# Patient Record
Sex: Female | Born: 1975 | Race: Black or African American | Hispanic: No | Marital: Single | Smoking: Never smoker
Health system: Southern US, Community
[De-identification: ages and names within clinical notes are randomized; demographics above are authoritative.]

## PROBLEM LIST (undated history)

## (undated) ENCOUNTER — Inpatient Hospital Stay: Payer: Self-pay

## (undated) DIAGNOSIS — I10 Essential (primary) hypertension: Secondary | ICD-10-CM

## (undated) DIAGNOSIS — E119 Type 2 diabetes mellitus without complications: Secondary | ICD-10-CM

## (undated) DIAGNOSIS — D649 Anemia, unspecified: Secondary | ICD-10-CM

## (undated) DIAGNOSIS — E7800 Pure hypercholesterolemia, unspecified: Secondary | ICD-10-CM

## (undated) HISTORY — DX: Essential (primary) hypertension: I10

## (undated) HISTORY — DX: Type 2 diabetes mellitus without complications: E11.9

## (undated) HISTORY — DX: Pure hypercholesterolemia, unspecified: E78.00

## (undated) HISTORY — DX: Anemia, unspecified: D64.9

## (undated) HISTORY — PX: COLONOSCOPY: SHX174

## (undated) HISTORY — PX: DENTAL SURGERY: SHX609

---

## 2001-03-31 ENCOUNTER — Other Ambulatory Visit: Admission: RE | Admit: 2001-03-31 | Discharge: 2001-03-31 | Payer: Self-pay | Admitting: Obstetrics and Gynecology

## 2006-10-22 ENCOUNTER — Ambulatory Visit: Payer: Self-pay | Admitting: Obstetrics & Gynecology

## 2009-06-29 ENCOUNTER — Emergency Department: Payer: Self-pay | Admitting: Emergency Medicine

## 2015-05-11 DIAGNOSIS — Z8249 Family history of ischemic heart disease and other diseases of the circulatory system: Secondary | ICD-10-CM | POA: Insufficient documentation

## 2015-05-11 DIAGNOSIS — A6000 Herpesviral infection of urogenital system, unspecified: Secondary | ICD-10-CM | POA: Insufficient documentation

## 2015-09-12 DIAGNOSIS — I10 Essential (primary) hypertension: Secondary | ICD-10-CM | POA: Insufficient documentation

## 2017-07-12 DIAGNOSIS — E785 Hyperlipidemia, unspecified: Secondary | ICD-10-CM | POA: Insufficient documentation

## 2019-04-16 ENCOUNTER — Other Ambulatory Visit: Payer: Self-pay

## 2019-04-16 DIAGNOSIS — Z20822 Contact with and (suspected) exposure to covid-19: Secondary | ICD-10-CM

## 2019-04-17 ENCOUNTER — Other Ambulatory Visit: Payer: Self-pay | Admitting: Family Medicine

## 2019-04-17 DIAGNOSIS — Z1231 Encounter for screening mammogram for malignant neoplasm of breast: Secondary | ICD-10-CM

## 2019-04-17 LAB — NOVEL CORONAVIRUS, NAA: SARS-CoV-2, NAA: NOT DETECTED

## 2019-06-04 ENCOUNTER — Ambulatory Visit
Admission: RE | Admit: 2019-06-04 | Discharge: 2019-06-04 | Disposition: A | Discharge status: Home / Self Care | Payer: BC Managed Care – PPO | Source: Ambulatory Visit | Attending: Family Medicine | Admitting: Family Medicine

## 2019-06-04 DIAGNOSIS — Z1231 Encounter for screening mammogram for malignant neoplasm of breast: Secondary | ICD-10-CM

## 2019-07-25 DIAGNOSIS — R739 Hyperglycemia, unspecified: Secondary | ICD-10-CM | POA: Insufficient documentation

## 2020-02-10 DIAGNOSIS — E119 Type 2 diabetes mellitus without complications: Secondary | ICD-10-CM | POA: Insufficient documentation

## 2020-02-19 ENCOUNTER — Encounter: Payer: BC Managed Care – PPO | Attending: Family Medicine | Admitting: Dietician

## 2020-02-19 ENCOUNTER — Other Ambulatory Visit: Payer: Self-pay

## 2020-02-19 ENCOUNTER — Encounter: Payer: Self-pay | Admitting: Dietician

## 2020-02-19 VITALS — Ht 66.0 in | Wt 246.5 lb

## 2020-02-19 DIAGNOSIS — E119 Type 2 diabetes mellitus without complications: Secondary | ICD-10-CM

## 2020-02-19 NOTE — Progress Notes (Signed)
Medical Nutrition Therapy: Visit start time: 1315  end time: 1415  Assessment:  Diagnosis: Type 2 DM Past medical history: HTN, HLD Psychosocial issues/ stress concerns: reports high stress, feels she is not dealing well with stress.  Preferred learning method:   Visual   Current weight: 246.5lbs Height: 5'6" Medications, supplements: reconciled list in medical record  Progress and evaluation:   Patient reports having pre-diabetes for the past 3 years, recently HbA1C increased to 6.6% so has now been diagnosed with Type 2 diabetes.   She has begun checking BGs at home with Accu Chek monitor. Reports fasting BGs ranging 110-115 average, has checked a few times after eating, highest was 175.   Has supportive friend who also has diabetes.  Patient is experiencing high stress due to preparing for return to in-person teaching, taking college classes, and parenting 2 teenagers. She feels her stress has in part led to increased blood sugars.  She would like to lose weight to goal of about 185lbs.  Physical activity: walking 40 minutes, 5-7 days a week, with friend  Dietary Intake:  Usual eating pattern includes 3 meals and 2-3 snacks per day. Dining out frequency: 2 meals per week.  Breakfast: on school days-- 2 link sausage and 2 boiled eggs, coffee with low sugar creamer or occ regular creamer; occ Mcdonalds. Usually skips when not in school Snack: 10am-- when at school -- crackers with sour cream and chives; honey nut cheerios (dry); activia yogurt  Lunch: school-- school lunch; at home-- sandwich with pimento cheese or chicken salad, (also used to eat egg salad) with cheetos, water with flavoring added Snack: cheerios; small bag Takis  Supper: 4-5:30pm-- does not cook much (forgets food on stove, etc); usually prepares [employer] frozen fam meals + bagged salad; or fast food [employer] or taco bell about 2x a week Snack: 9:30pm--tries to have protein helps keep FBGs lower -- 2 boiled  eggs; P3 snack pack-- nuts, cheese, meat; cheese and whole wheat Ritz crackers Beverages: water with sugar free flavoring; coffee, occ regular soda  Nutrition Care Education: Topics covered:  Basic nutrition: basic food groups, appropriate nutrient balance, appropriate meal and snack schedule, general nutrition guidelines    Weight control: importance of low sugar and low fat choices, portion control, estimated energy needs for weight loss at 1800 kcal, provided guidance for 45% CHO, 25% protein, and 30% fat Advanced nutrition: cooking techniques, dining out, food label reading for carbohydrate Diabetes:  goals for BGs, appropriate meal and snack schedule, appropriate carb intake and balance, healthy carb choices, role of fiber, protein, fat; roles of exercise, stress   Nutritional Diagnosis:  [place]-2.2 Altered nutrition-related laboratory As related to diabetes.  As evidenced by recent HbA1C of 6.6%. [place]-3.3 Overweight/obesity As related to excess calories, stress.  As evidenced by patient with current BMI of 39.8, working on diet and lifestyle changes to promote weight loss and improve BGs.  Intervention:   Instruction and discussion as noted above.  Patient has been working on healthy changes and is motivated to continue; has good support from close friend.  Established goals for additional change, with direction from patient.   Education Materials given:   [employer] guidelines for Diabetes  Plate Planner with food lists, sample meal pattern  Sample menus  Goals/ instructions   Learner/ who was taught:   Patient    Level of understanding:  Verbalizes/ demonstrates competency   Demonstrated degree of understanding via:   Teach back Learning barriers:  None  Willingness  to learn/ readiness for change:  Eager, change in progress   Monitoring and Evaluation:  Dietary intake, exercise, BG control, and body weight      follow up: 03/30/20 at 4:00pm

## 2020-02-19 NOTE — Patient Instructions (Signed)
   Check food labels for total carbohydrate, aim for 30-45grams with each meal (make sure to adjust the serving size if needed to meet the goal).   Try plating low-carb veggies first with meals to leave less room for starchy foods.   Great job controlling sugar intake, keep up the good work!  Keep up the regular exercise!

## 2020-03-30 ENCOUNTER — Other Ambulatory Visit: Payer: Self-pay

## 2020-03-30 ENCOUNTER — Encounter: Payer: BC Managed Care – PPO | Attending: Family Medicine | Admitting: Dietician

## 2020-03-30 ENCOUNTER — Encounter: Payer: Self-pay | Admitting: Dietician

## 2020-03-30 VITALS — Ht 66.0 in | Wt 245.4 lb

## 2020-03-30 DIAGNOSIS — E119 Type 2 diabetes mellitus without complications: Secondary | ICD-10-CM | POA: Diagnosis not present

## 2020-03-30 NOTE — Patient Instructions (Signed)
   Continue with protein snack in the evenings to help with blood sugar control.   Keep up the great regular exercise!  Include a vegetable or fruit as often as possible; try adding some grape tomatoes, sliced or mini bell peppers, or baby carrots with sandwich at lunch.

## 2020-03-30 NOTE — Progress Notes (Signed)
Medical Nutrition Therapy: Visit start time: 1600  end time: 1630  Assessment:  Diagnosis: Type 2 diabetes Medical history changes: no changes Psychosocial issues/ stress concerns: high stress level  Current weight: 245.4lbs  Height: 5'6"  Medications, supplement changes: no changes per patient  Progress and evaluation:  . Weight has decreased by 1.5lbs since previous visit. . Patient reports more label reading, using [employer] pick up to check products online before buying.  . 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  Marland Kitchen Has been very tired in evening in the past week, and missing evening snack, and fasting BGs have been in 150s. Ate boiled egg yesterday evening and BG this morning was 112.  Marland Kitchen Checks BGs fasting and before lunch daily. . She has been searching for healthy meal ideas and recipes on Pinterest to improve nutritional balance while keeping cooking simple and quick.   Physical activity: walking 40 minutes, 3x a week with friend, 2x a week alone + more general activity  Dietary Intake:  Usual eating pattern includes 2-3 meals and 2 snacks per day. Dining out frequency: 1-2 meals per week (dinner).  Breakfast: boiled egg and sasuage or protein shake; skips when not at work Snack: 10am P3 packed snack; sour cream and chive crackers, dry cereal, yogurt Lunch: sandwich with egg salad or pimento cheese + side from school; school lunch; sandwich when home Snack: cheerios or Takis; pretzels Supper: frozen family meals; one-pan roasted meals (trying to set timer); looking up low-carb easy recipes; eating out some with child in driver's ed, ends tomorrow Snack: protein based snack, including occasional protein drink Beverages: water, some with sugar free flavoring; coffee; had one soda in past few weeks  Nutrition Care Education: Topics covered:  Basic nutrition: importance of frequent intake of veg and fruits for overall health risk reduction as well as  weight control    Weight control: reviewed progress since previous visit Advanced nutrition:  Quick cooking strategies, one-dish meal options Diabetes:  healthy carb choices for lower glycemic response, effect of evening snack on fasting BGs   Nutritional Diagnosis:  [place]-2.2 Altered nutrition-related laboratory As related to Type 2 diabetes.  As evidenced by recent HbA1C of 6.6%. [place]-3.3 Overweight/obesity As related to history of excess calories and inadequate physical activity.  As evidenced by patient with current BMI of 39.6, making diet and lifestyle changes to promote weight loss and blood sugar control.  Intervention:  . Instruction and discussion as noted above. . Commended patient for working on healthy meal options and maintaining regular exercise. Marland Kitchen Updated nutrition goals with direction from patient.  Education Materials given:  . [employer] a [employer] . Goals/ instructions   Learner/ who was taught:  . Patient   Level of understanding: Marland Kitchen Verbalizes/ demonstrates competency   Demonstrated degree of understanding via:   Teach back Learning barriers: . None  Willingness to learn/ readiness for change: . Eager, change in progress  Monitoring and Evaluation:  Dietary intake, exercise, BG control, and body weight      follow up: 06/01/20 at 4:00pm

## 2020-06-01 ENCOUNTER — Encounter: Payer: BC Managed Care – PPO | Attending: Family Medicine | Admitting: Dietician

## 2020-06-01 ENCOUNTER — Other Ambulatory Visit: Payer: Self-pay

## 2020-06-01 ENCOUNTER — Encounter: Payer: Self-pay | Admitting: Dietician

## 2020-06-01 VITALS — Ht 66.0 in | Wt 239.4 lb

## 2020-06-01 DIAGNOSIS — E119 Type 2 diabetes mellitus without complications: Secondary | ICD-10-CM | POA: Diagnosis not present

## 2020-06-01 NOTE — Patient Instructions (Signed)
   Awesome job making healthy choices and planning ahead for holidays!!  Allow for occasional treats as needed with guilty feelings.   Keep up the regular exercise!

## 2020-06-01 NOTE — Progress Notes (Signed)
Medical Nutrition Therapy: Visit start time: 1600  end time: 1630  Assessment:  Diagnosis: Type 2 diabetes Medical history changes: no changes Psychosocial issues/ stress concerns: none  Current weight: 239.4lbs Height: 5'6" Medications, supplement changes: reconciled list in medical record  Progress and evaluation:   Fasting BGs 82, 104, 112, 101, 102, 108. Had a reading of 133 after walking dog and doing some chores but before eating.  Before lunch BGs typically in 90s.   Weight loss of 6lbs since previous visit on 03/30/20.  Continues to increase home meals, cooking 4-5 times a week now. She and her son have been using skillet meal handout and planning meals together.    Physical activity: walking 40 minutes 4-5 times a week  Dietary Intake:  Usual eating pattern includes 3 meals and 1-2 snacks per day. Dining out frequency: 3-4 meals per week.  Breakfast: usually protein shake recently, sometimes with fruit or yogurt  Snack: veggie straws snack Lunch: school lunch Snack: 2:30pm fruit or mini lunchable or P3 snack pack Supper: 6-7pm -- one-pot meals, frozen fam meal Snack: none Beverages: water plain for often than flavored (1x a day with lunch); less coffee now once every 2 weeks; no sodas recently  Nutrition Care Education: Topics covered:     Weight control: reviewed progress since previous visit; discussed benefits of personal support; discussed healthy relationship and mindset regarding food; instructed on avoidance of guilt feelings about food consumption, addressing food cravings as needed, and planning ahead for special circumstances such as holidays Diabetes:  reviewed appropriate nutritional balance for meals and snacks   Nutritional Diagnosis:  [place]-2.2 Altered nutrition-related laboratory As related to Type 2 diabetes.  As evidenced by patient with recent HbA1C of 6.6%. [place]-3.3 Overweight/obesity As related to history of excess calories and inadequate physical  activity.  As evidenced by patient with current BMI of 38, working on healthy diet and regular exercise for ongoing weight loss.  Intervention:   Instruction and discussion as noted above.  Commended patient for her progress.   No new nutrition goals, patient will continue with her current healthy habits, supported by friend.   No follow-up scheduled at this time; patient will schedule later if needed.   Education Materials given:    Learner/ who was taught:   Patient   Level of understanding:  Verbalizes/ demonstrates competency   Demonstrated degree of understanding via:   Teach back Learning barriers:  None  Willingness to learn/ readiness for change:  Eager, change in progress   Monitoring and Evaluation:  Dietary intake, exercise, BG control, and body weight      follow up: prn

## 2020-10-06 ENCOUNTER — Other Ambulatory Visit: Payer: Self-pay | Admitting: Family Medicine

## 2020-10-06 DIAGNOSIS — Z1231 Encounter for screening mammogram for malignant neoplasm of breast: Secondary | ICD-10-CM

## 2020-10-24 ENCOUNTER — Ambulatory Visit
Admission: RE | Admit: 2020-10-24 | Discharge: 2020-10-24 | Disposition: A | Discharge status: Home / Self Care | Payer: BC Managed Care – PPO | Source: Ambulatory Visit | Attending: Family Medicine | Admitting: Family Medicine

## 2020-10-24 ENCOUNTER — Other Ambulatory Visit: Payer: Self-pay

## 2020-10-24 DIAGNOSIS — Z1231 Encounter for screening mammogram for malignant neoplasm of breast: Secondary | ICD-10-CM | POA: Diagnosis present

## 2021-08-11 IMAGING — MG MM DIGITAL SCREENING BILAT W/ TOMO AND CAD
8 series · 8 of 24 positions shown · non-contrast
Comparison: Previous exam(s).

CLINICAL DATA: Screening.

EXAM:
DIGITAL SCREENING BILATERAL MAMMOGRAM WITH TOMOSYNTHESIS AND CAD
TECHNIQUE: Bilateral screening digital craniocaudal and mediolateral oblique
mammograms were obtained. Bilateral screening digital breast
tomosynthesis was performed. The images were evaluated with
computer-aided detection.

[L CC synth-2D]
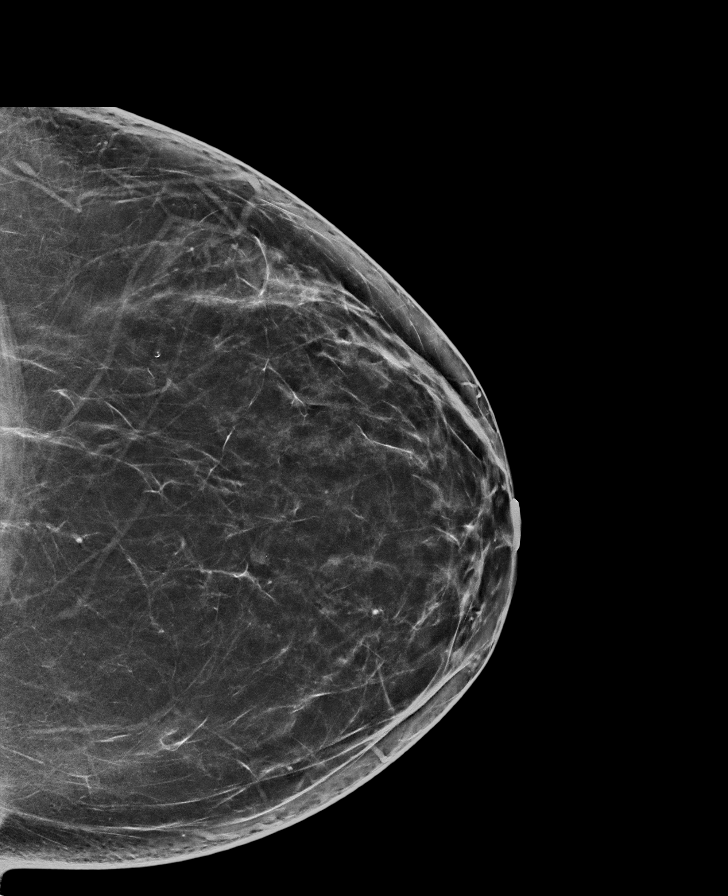

[L MLO synth-2D]
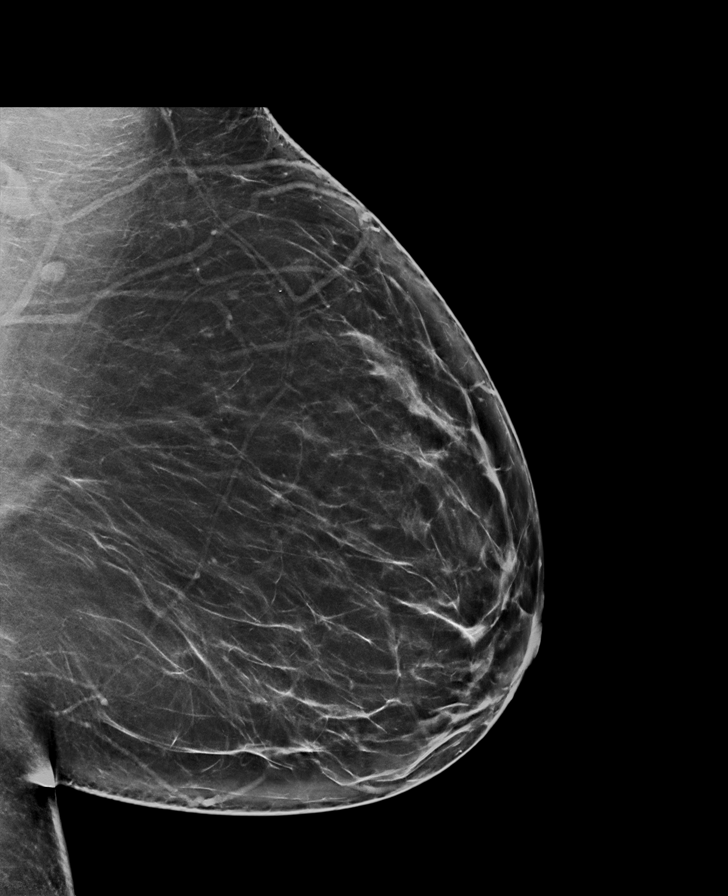

[R MLO synth-2D]
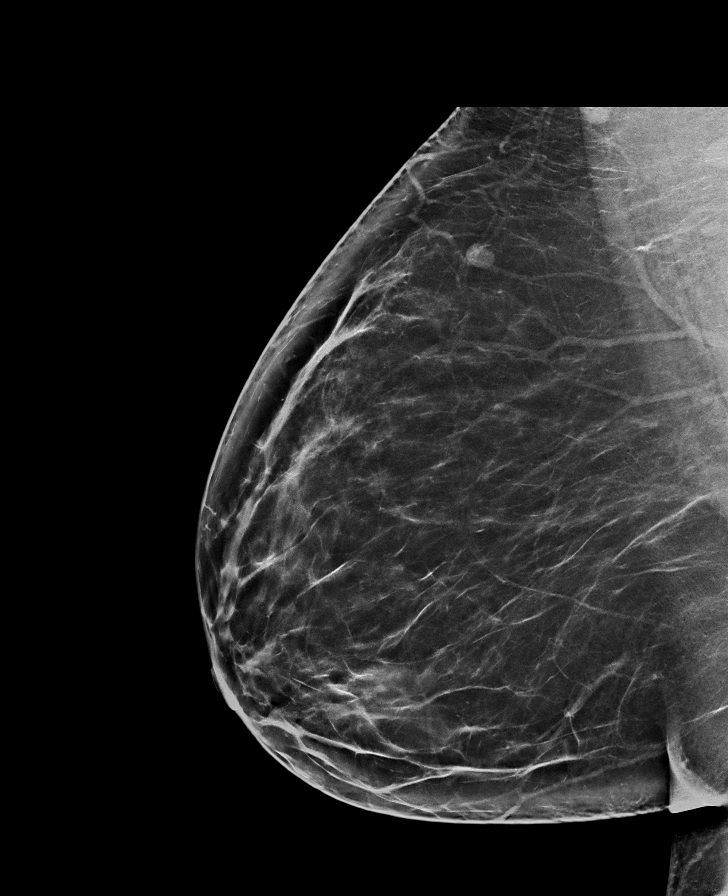

[R CC synth-2D]
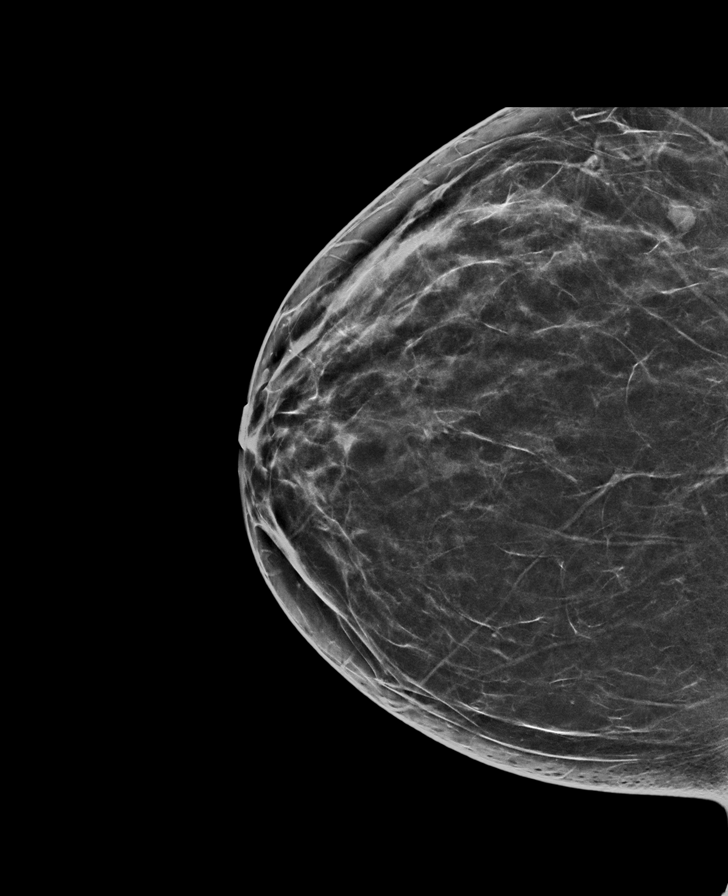

[R CC tomo · tomo slice 38/75.0]
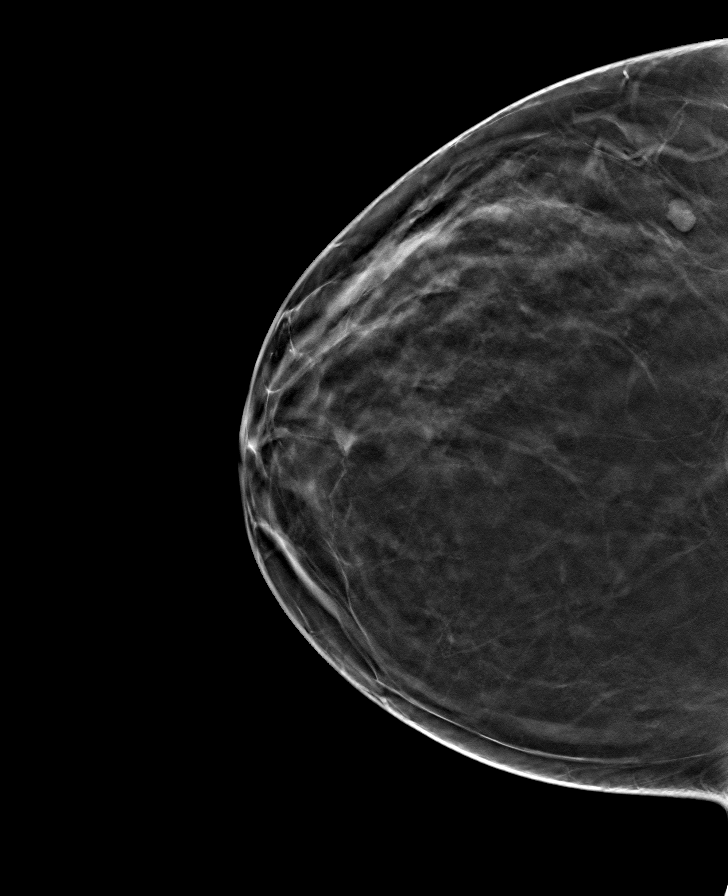

[L MLO tomo · tomo slice 47/93.0]
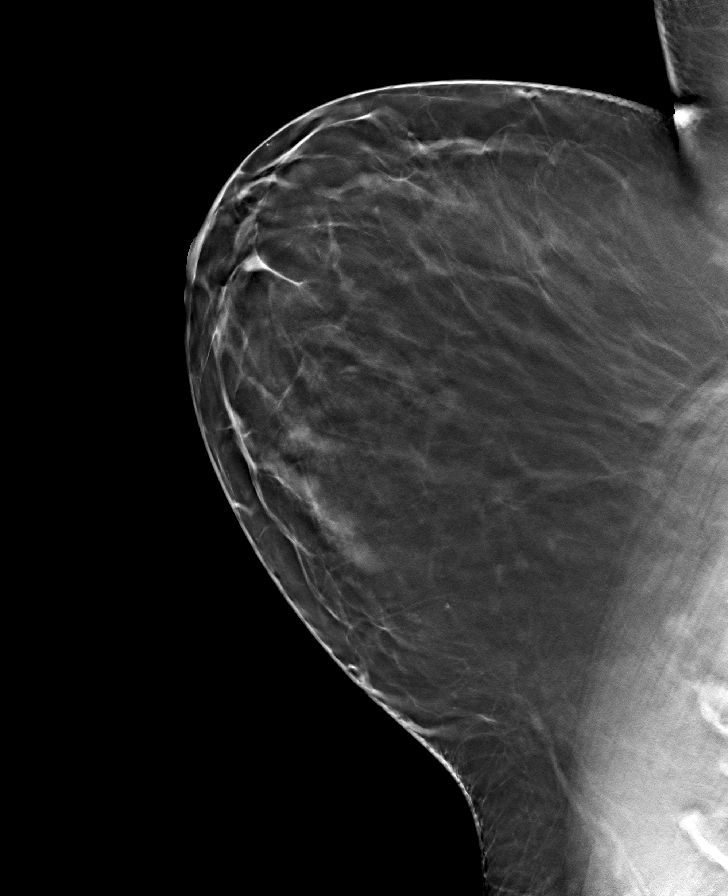

[L CC tomo · tomo slice 42/83.0]
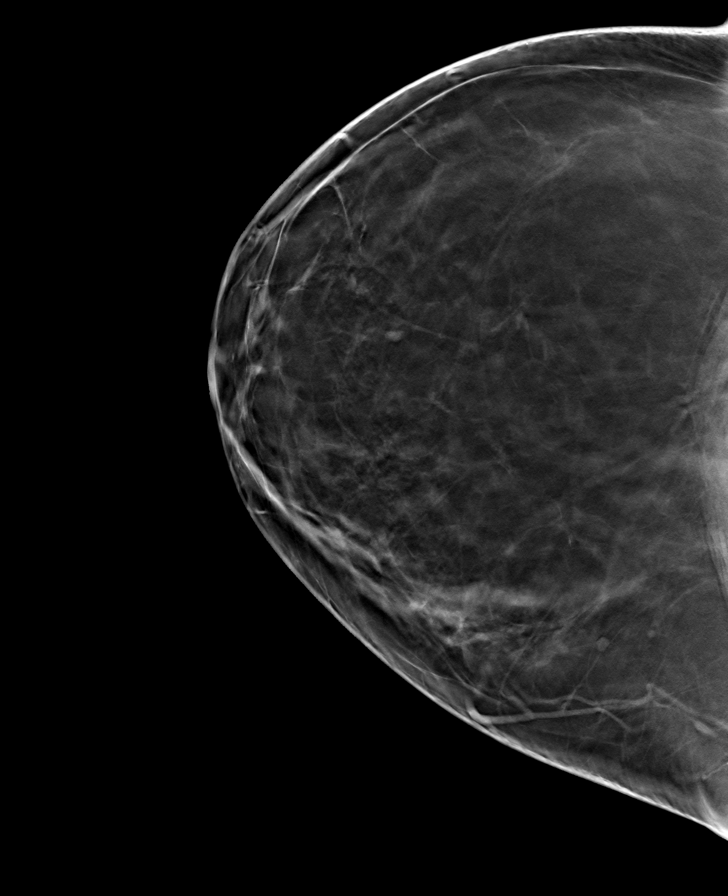

[R MLO tomo · tomo slice 46/91.0]
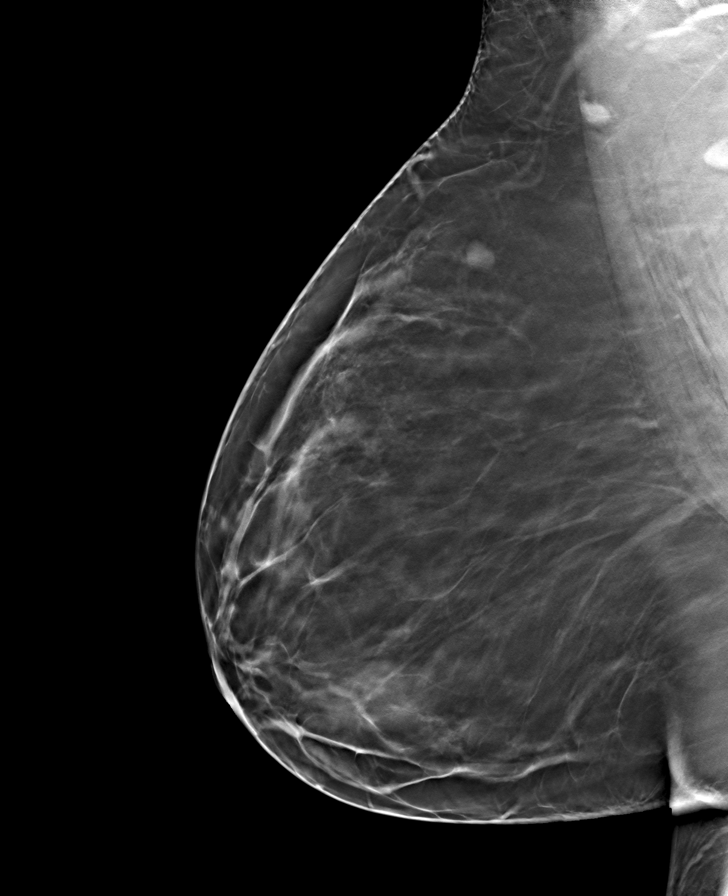

[8 of 24 positions shown; findings below may reference images not displayed]

ACR Breast Density Category b: There are scattered areas of
fibroglandular density.
FINDINGS: There are no findings suspicious for malignancy. The images were
evaluated with computer-aided detection.
IMPRESSION: No mammographic evidence of malignancy. A result letter of this
screening mammogram will be mailed directly to the patient.

RECOMMENDATION:
Screening mammogram in one year. (Code:[id number]-[id number])

BI-RADS CATEGORY  1: Negative.

## 2023-12-06 ENCOUNTER — Ambulatory Visit: Payer: Self-pay

## 2023-12-06 DIAGNOSIS — Z1211 Encounter for screening for malignant neoplasm of colon: Secondary | ICD-10-CM | POA: Diagnosis present

## 2023-12-06 DIAGNOSIS — K635 Polyp of colon: Secondary | ICD-10-CM | POA: Diagnosis not present

## 2024-01-08 DIAGNOSIS — D219 Benign neoplasm of connective and other soft tissue, unspecified: Secondary | ICD-10-CM | POA: Insufficient documentation

## 2024-01-14 ENCOUNTER — Inpatient Hospital Stay: Attending: Internal Medicine | Admitting: Internal Medicine

## 2024-01-14 ENCOUNTER — Encounter: Payer: Self-pay | Admitting: Internal Medicine

## 2024-01-14 ENCOUNTER — Inpatient Hospital Stay

## 2024-01-14 VITALS — BP 149/73 | HR 69 | Temp 99.0°F | Resp 16 | Ht 66.0 in | Wt 247.7 lb

## 2024-01-14 DIAGNOSIS — Z803 Family history of malignant neoplasm of breast: Secondary | ICD-10-CM | POA: Insufficient documentation

## 2024-01-14 DIAGNOSIS — Z3202 Encounter for pregnancy test, result negative: Secondary | ICD-10-CM | POA: Insufficient documentation

## 2024-01-14 DIAGNOSIS — E611 Iron deficiency: Secondary | ICD-10-CM | POA: Insufficient documentation

## 2024-01-14 DIAGNOSIS — E119 Type 2 diabetes mellitus without complications: Secondary | ICD-10-CM | POA: Diagnosis not present

## 2024-01-14 DIAGNOSIS — G43109 Migraine with aura, not intractable, without status migrainosus: Secondary | ICD-10-CM | POA: Insufficient documentation

## 2024-01-14 DIAGNOSIS — D649 Anemia, unspecified: Secondary | ICD-10-CM

## 2024-01-14 DIAGNOSIS — Z7984 Long term (current) use of oral hypoglycemic drugs: Secondary | ICD-10-CM | POA: Insufficient documentation

## 2024-01-14 DIAGNOSIS — N920 Excessive and frequent menstruation with regular cycle: Secondary | ICD-10-CM | POA: Diagnosis not present

## 2024-01-14 LAB — PREGNANCY, URINE: Preg Test, Ur: NEGATIVE

## 2024-01-14 NOTE — Progress Notes (Signed)
 Fatigue/weakness: NO Dyspena: NO  Light headedness: NO  Blood in stool: NO

## 2024-01-14 NOTE — Assessment & Plan Note (Addendum)
#   Anemia- Hb-8-9; ferritin- 6 [June 2025- PCP] symptomatic.  iron deficiency - from  menorrhagia.  Poor tolerance to oral iron-constipation  # Given poor tolerance/lack of improvement on oral iron.  Discussed regarding IV iron infusion/Venofer. Discussed the potential acute infusion reactions with IV iron; which are quite rare.  Patient understands the risk; will proceed with infusions.  Check urine pregnancy test.  #Etiology of iron deficiency: Vaginal bleeding: very heavy [gyn Dr.Schermeorhon; [place]]- US - fibroids. colonoscopy;- May 2025.   # DM- on metformin   Thank you Dr. Zachary for allowing me to participate in the care of your pleasant patient. Please do not hesitate to contact me with questions or concerns in the interim.   # DISPOSITION: # NO labs today; add urine pregnancy test # weekly venofer x 4 - START ASAP  # follow up  2 months- MD; labs- cbc/bmp;  urine pregnancy test; possible venofer- Dr.B

## 2024-01-14 NOTE — Progress Notes (Signed)
 [facility] CONSULT NOTE  Patient Care Team: Zachary Idelia LABOR, MD as PCP - General (Family Medicine)  CHIEF COMPLAINTS/PURPOSE OF CONSULTATION: ANEMIA   HEMATOLOGY HISTORY  # ANEMIA[Hb; MCV-platelets- WBC; Iron sat; ferritin;  GFR- CT/US - ;    6.2 5.2 6.3 5.8 Load older lab results    Hemoglobin 8.9 Low    9.5 Low    8.1 Low    8.3 Low    10.5 Low    Load older lab results  Hematocrit 31.7 Low    34.6 Low    29 Low    28.3 Low    34.6 Low    Load older lab results  Platelets 379 355 329 371 304 Load older lab results  MCV (Mean Corpuscular Volume) 79 Low    75 Low    73 Low    74 Low    82 Load older lab results  MCH (Mean Corpuscular Hemoglobin) 22.2 Low    20.5 Low    20.5 Low    21.7 Low    24.8 Low    Load older lab results  MCHC (Mean Corpuscular Hemoglobin Concentration) 28.1 Low    27.5 Low    27.9 Low    29.3 Low    30.3 Low    Load older lab results  RBC (Red Blood Cell Count) 4.01 4.63 3.96 3.82 4.24 Load older lab results  RDW-CV (Red Cell Distribution Width) 16.9 High    21.2 High    18.7 High    17.1 High    16.8 High    Load older lab results  MPV (Mean Platelet Volume) 10.7 10 10.1 9.7 10.4 Load older lab results  NRBC (Nucleated Red Blood Cell Count) 0.00 0 0 0.02 High    0 Load older lab results  NRBC % (Nucleated Red Blood Cell %) 0.0 0 0 0.3 0 Load older lab results   CHEM PROFILE ?12/25/2023 - CHEM PROFILE [facility])?  DIABETES ?12/25/2023 - DIABETES [facility])?  DIFFERENTIAL ?12/25/2023 - DIFFERENTIAL [facility])?  IRON [name] ?12/25/2023 - IRON [name] [facility])? Component 12/25/2023 04/23/2023 08/22/2022 02/28/2022        Ferritin 3 Low    4 Low    4 Low    4 Low      THYROID  HISTORY OF PRESENTING ILLNESS: Patient ambulating-independently.  Alone   Linda Shaffer 48 y.o.  female pleasant patient is   been referred to us  for further evaluation of anemia.  Patient complains of heavy menstrual cycles.  She is currently being evaluated by gynecology with an ultrasound.  Noted to have fibroids.  Patient complains of shortness of breath with exertion.  Also complains of excessive fatigue.     [place]:-ice   Blood in stools: none EGD- none/colonoscopy-may 2025- [polyp-] Blood in urine:none  Change of bowel movement/constipation:none  Prior blood transfusion: Kidney/Liver disease: none  Alcohol: 1-2 drink per week.  Bariatric surgery: none  Prior evaluation with hematology: none   Review of Systems  Constitutional:  Positive for malaise/fatigue. Negative for chills, diaphoresis, fever and weight loss.  HENT:  Negative for nosebleeds and sore throat.   Eyes:  Negative for double vision.  Respiratory:  Negative for cough, hemoptysis, sputum production, shortness of breath and wheezing.   Cardiovascular:  Negative for chest pain, palpitations, orthopnea and leg swelling.  Gastrointestinal:  Negative for abdominal pain, blood in stool, constipation, diarrhea, heartburn, melena,  nausea and vomiting.  Genitourinary:  Negative for dysuria, frequency and urgency.  Musculoskeletal:  Negative for back pain and joint pain.  Skin: Negative.  Negative for itching and rash.  Neurological:  Negative for dizziness, tingling, focal weakness, weakness and headaches.  Endo/Heme/Allergies:  Does not bruise/bleed easily.  Psychiatric/Behavioral:  Negative for depression. The patient is not nervous/anxious and does not have insomnia.      MEDICAL HISTORY:  Past Medical History:  Diagnosis Date   Diabetes mellitus without complication (HCC)    Hypercholesteremia    Hypertension     SURGICAL HISTORY: History reviewed. No pertinent surgical history.  SOCIAL HISTORY: Social History   Socioeconomic History   Marital status: Single    Spouse name: Not on file   Number of children: Not on file   Years  of education: Not on file   Highest education level: Not on file  Occupational History   Not on file  Tobacco Use   Smoking status: Never   Smokeless tobacco: Never  Vaping Use   Vaping status: Never Used  Substance and Sexual Activity   Alcohol use: Yes    Alcohol/week: 1.0 standard drink of alcohol    Types: 1 Standard drinks or equivalent per week   Drug use: Never   Sexual activity: Not on file  Other Topics Concern   Not on file  Social History Narrative   Not on file   Social Drivers of Health   Financial Resource Strain: Medium Risk (08/24/2023)   Received from [facility]   Overall Financial Resource Strain (CARDIA)    Difficulty of Paying Living Expenses: Somewhat hard  Food Insecurity: No Food Insecurity (01/14/2024)   Hunger Vital Sign    Worried About Running Out of Food in the Last Year: Never true    Ran Out of Food in the Last Year: Never true  Transportation Needs: No Transportation Needs (01/14/2024)   PRAPARE - Administrator, Civil Service (Medical): No    Lack of Transportation (Non-Medical): No  Physical Activity: Not on file  Stress: Not on file  Social Connections: Not on file  Intimate Partner Violence: Not At Risk (01/14/2024)   Humiliation, Afraid, Rape, and Kick questionnaire    Fear of Current or Ex-Partner: No    Emotionally Abused: No    Physically Abused: No    Sexually Abused: No    FAMILY HISTORY: Family History  Problem Relation Age of Onset   Breast cancer Mother 23   Stroke Mother    Hypercholesterolemia Mother    Heart attack Mother    Hypertension Mother    Diabetes Mother    Hypertension Father    Heart attack Brother    Breast cancer Maternal Aunt 45   Breast cancer Maternal Grandmother 43   Dementia Paternal Grandmother    Cancer Paternal Grandfather    Stroke Paternal Grandfather     ALLERGIES:  is allergic to peanut oil.  MEDICATIONS:  Current Outpatient Medications  Medication Sig  Dispense Refill   ACCU-CHEK GUIDE test strip      Accu-Chek Softclix Lancets lancets SMARTSIG:Topical     amphetamine-dextroamphetamine (ADDERALL XR) 5 MG 24 hr capsule Take by mouth.     aspirin 81 MG chewable tablet Chew by mouth.     EPINEPHrine 0.3 mg/0.3 mL IJ SOAJ injection SMARTSIG:0.3 Milligram(s) IM Once PRN     fluticasone (FLONASE) 50 MCG/ACT nasal spray Place into both nostrils.     losartan (COZAAR)  25 MG tablet Take 25 mg by mouth 2 (two) times daily.     metFORMIN (GLUCOPHAGE) 500 MG tablet Take 500 mg by mouth daily with breakfast.     metoprolol tartrate (LOPRESSOR) 25 MG tablet Take 25 mg by mouth 2 (two) times daily.     pravastatin (PRAVACHOL) 40 MG tablet Take 40 mg by mouth daily.     No current facility-administered medications for this visit.     PHYSICAL EXAMINATION:   Vitals:   01/14/24 1046  BP: (!) 149/73  Pulse: 69  Resp: 16  Temp: 99 F (37.2 C)  SpO2: (!) 18%   Filed Weights   01/14/24 1046  Weight: 247 lb 11.2 oz (112.4 kg)    Physical Exam Vitals and nursing note reviewed.  HENT:     Head: Normocephalic and atraumatic.     Mouth/Throat:     Pharynx: Oropharynx is clear.   Eyes:     Extraocular Movements: Extraocular movements intact.     Pupils: Pupils are equal, round, and reactive to light.    Cardiovascular:     Rate and Rhythm: Normal rate and regular rhythm.  Pulmonary:     Comments: Decreased breath sounds bilaterally.  Abdominal:     Palpations: Abdomen is soft.   Musculoskeletal:        General: Normal range of motion.     Cervical back: Normal range of motion.   Skin:    General: Skin is warm.   Neurological:     General: No focal deficit present.     Mental Status: She is alert and oriented to person, place, and time.   Psychiatric:        Behavior: Behavior normal.        Judgment: Judgment normal.      LABORATORY DATA:  I have reviewed the data as listed No results found for: WBC, HGB, HCT,  MCV, PLT No results for input(s): NA, K, CL, CO2, GLUCOSE, BUN, CREATININE, CALCIUM, GFRNONAA, GFRAA, PROT, ALBUMIN, AST, ALT, ALKPHOS, BILITOT, BILIDIR, IBILI in the last 8760 hours.   No results found.  ASSESSMENT & PLAN:   Symptomatic anemia # Anemia- Hb-8-9; ferritin- 6 [June 2025- PCP] symptomatic.  iron deficiency - from  menorrhagia.  Poor tolerance to oral iron-constipation  # Given poor tolerance/lack of improvement on oral iron.  Discussed regarding IV iron infusion/Venofer. Discussed the potential acute infusion reactions with IV iron; which are quite rare.  Patient understands the risk; will proceed with infusions.  Check urine pregnancy test.  #Etiology of iron deficiency: Vaginal bleeding: very heavy [gyn Dr.Schermeorhon; [place]]- US - fibroids. colonoscopy;- May 2025.   # DM- on metformin   Thank you Dr. Zachary for allowing me to participate in the care of your pleasant patient. Please do not hesitate to contact me with questions or concerns in the interim.   # DISPOSITION: # NO labs today; add urine pregnancy test # weekly venofer x 4 - START ASAP  # follow up  2 months- MD; labs- cbc/bmp;  urine pregnancy test; possible venofer- Dr.B    All questions were answered. The patient knows to call the clinic with any problems, questions or concerns.    Cindy JONELLE Joe, MD 01/14/2024 1:07 PM

## 2024-01-15 ENCOUNTER — Inpatient Hospital Stay

## 2024-01-15 VITALS — BP 137/78 | HR 82 | Temp 98.9°F | Resp 18

## 2024-01-15 DIAGNOSIS — D649 Anemia, unspecified: Secondary | ICD-10-CM

## 2024-01-15 MED ORDER — SODIUM CHLORIDE 0.9% FLUSH
10.0000 mL | Freq: Once | INTRAVENOUS | Status: AC | PRN
  Administered 2024-01-15: 10 mL
  Filled 2024-01-15: qty 10

## 2024-01-15 MED ORDER — IRON SUCROSE 20 MG/ML IV SOLN
200.0000 mg | Freq: Once | INTRAVENOUS | Status: AC
  Administered 2024-01-15: 200 mg via INTRAVENOUS

## 2024-01-16 ENCOUNTER — Other Ambulatory Visit: Payer: Self-pay

## 2024-01-16 ENCOUNTER — Encounter
Admission: RE | Admit: 2024-01-16 | Discharge: 2024-01-16 | Disposition: A | Discharge status: Home / Self Care | Source: Ambulatory Visit | Attending: Obstetrics and Gynecology | Admitting: Obstetrics and Gynecology

## 2024-01-16 VITALS — BP 160/73 | HR 67 | Resp 18 | Ht 65.5 in | Wt 242.5 lb

## 2024-01-16 DIAGNOSIS — Z01818 Encounter for other preprocedural examination: Secondary | ICD-10-CM | POA: Diagnosis present

## 2024-01-16 DIAGNOSIS — R9431 Abnormal electrocardiogram [ECG] [EKG]: Secondary | ICD-10-CM | POA: Insufficient documentation

## 2024-01-16 DIAGNOSIS — R739 Hyperglycemia, unspecified: Secondary | ICD-10-CM

## 2024-01-16 DIAGNOSIS — I10 Essential (primary) hypertension: Secondary | ICD-10-CM | POA: Diagnosis not present

## 2024-01-16 DIAGNOSIS — E119 Type 2 diabetes mellitus without complications: Secondary | ICD-10-CM | POA: Insufficient documentation

## 2024-01-16 DIAGNOSIS — D649 Anemia, unspecified: Secondary | ICD-10-CM | POA: Diagnosis not present

## 2024-01-16 DIAGNOSIS — E7800 Pure hypercholesterolemia, unspecified: Secondary | ICD-10-CM

## 2024-01-16 LAB — BASIC METABOLIC PANEL WITH GFR
Anion gap: 10 (ref 5–15)
BUN: 11 mg/dL (ref 6–20)
CO2: 23 mmol/L (ref 22–32)
Calcium: 9.3 mg/dL (ref 8.9–10.3)
Chloride: 108 mmol/L (ref 98–111)
Creatinine, Ser: 0.72 mg/dL (ref 0.44–1.00)
GFR, Estimated: >60 mL/min (ref >60)
Glucose, Bld: 92 mg/dL (ref 70–99)
Potassium: 3.6 mmol/L (ref 3.5–5.1)
Sodium: 141 mmol/L (ref 135–145)

## 2024-01-16 LAB — CBC
HCT: 33.2 % — ABNORMAL LOW (ref 36.0–46.0)
Hemoglobin: 9.6 g/dL — ABNORMAL LOW (ref 12.0–15.0)
MCH: 22.1 pg — ABNORMAL LOW (ref 26.0–34.0)
MCHC: 28.9 g/dL — ABNORMAL LOW (ref 30.0–36.0)
MCV: 76.3 fL — ABNORMAL LOW (ref 80.0–100.0)
Platelets: 402 10*3/uL — ABNORMAL HIGH (ref 150–400)
RBC: 4.35 MIL/uL (ref 3.87–5.11)
RDW: 18.5 % — ABNORMAL HIGH (ref 11.5–15.5)
WBC: 6.5 10*3/uL (ref 4.0–10.5)
nRBC: 0 % (ref 0.0–0.2)

## 2024-01-16 LAB — TYPE AND SCREEN
ABO/RH(D): B POS
Antibody Screen: NEGATIVE

## 2024-01-16 NOTE — Patient Instructions (Addendum)
 Your procedure is scheduled on: Tuesday 01/21/24 Report to the Registration Desk on the 1st floor of the [facility]. To find out your arrival time, please call [phone] between 1PM - 3PM on: Monday 01/20/24 If your arrival time is 6:00 am, do not arrive before that time as the [facility] entrance doors do not open until 6:00 am.  REMEMBER: Instructions that are not followed completely may result in serious medical risk, up to and including death; or upon the discretion of your surgeon and anesthesiologist your surgery may need to be rescheduled.  Do not eat food after midnight the night before surgery.  No gum chewing or hard candies.  You may however, drink CLEAR liquids up to 2 hours before you are scheduled to arrive for your surgery. Do not drink anything within 2 hours of your scheduled arrival time.  Clear liquids include: - water   One week prior to surgery: Stop Anti-inflammatories (NSAIDS) such as Advil, Aleve, Ibuprofen, Motrin, Naproxen, Naprosyn and Aspirin based products such as Excedrin, Goody's Powder, BC Powder. You may however, continue to take Tylenol if needed for pain up until the day of surgery.  Stop ANY OVER THE COUNTER or prescription supplements and vitamins until after surgery.  Continue taking all of your other prescription medications up until the day of surgery except: Metformin hold for 2 days, last dose Saturday  ON THE DAY OF SURGERY ONLY TAKE THESE MEDICATIONS WITH SIPS OF WATER:  metoprolol tartrate (LOPRESSOR) 25 MG tablet  valACYclovir (VALTREX) 500 MG tablet   No Alcohol for 24 hours before or after surgery.  No Smoking including e-cigarettes for 24 hours before surgery.  No chewable tobacco products for at least 6 hours before surgery.  No nicotine patches on the day of surgery.  Do not use any recreational drugs for at least a week (preferably 2 weeks) before your surgery.  Please be advised that the combination of cocaine and  anesthesia may have negative outcomes, up to and including death. If you test positive for cocaine, your surgery will be cancelled.  On the morning of surgery brush your teeth with toothpaste and water, you may rinse your mouth with mouthwash if you wish. Do not swallow any toothpaste or mouthwash.  Use CHG Soap or wipes as directed on instruction sheet.  Do not wear jewelry, make-up, hairpins, clips or nail polish.  For welded (permanent) jewelry: bracelets, anklets, waist bands, etc.  Please have this removed prior to surgery.  If it is not removed, there is a chance that hospital personnel will need to cut it off on the day of surgery.  Do not wear lotions, powders, or perfumes.   Do not shave body hair from the neck down 48 hours before surgery.  Contact lenses, hearing aids and dentures may not be worn into surgery.  Do not bring valuables to the hospital. [facility] is not responsible for any missing/lost belongings or valuables.   Notify your doctor if there is any change in your medical condition (cold, fever, infection).  Wear comfortable clothing (specific to your surgery type) to the hospital.  After surgery, you can help prevent lung complications by doing breathing exercises.  Take deep breaths and cough every 1-2 hours. Your doctor may order a device called an Incentive Spirometer to help you take deep breaths. When coughing or sneezing, hold a pillow firmly against your incision with both hands. This is called "splinting." Doing this helps protect your incision. It also decreases belly  discomfort.  If you are being admitted to the hospital overnight, leave your suitcase in the car. After surgery it may be brought to your room.  In case of increased patient census, it may be necessary for you, the patient, to continue your postoperative care in the Same Day Surgery department.  If you are being discharged the day of surgery, you will not be allowed to drive home. You  will need a responsible individual to drive you home and stay with you for 24 hours after surgery.   If you are taking public transportation, you will need to have a responsible individual with you.  Please call the Pre-admissions Testing Dept. at [phone] if you have any questions about these instructions.  Surgery Visitation Policy:  Patients having surgery or a procedure may have two visitors.  Children under the age of 86 must have an adult with them who is not the patient.  Inpatient Visitation:    Visiting hours are 7 a.m. to 8 p.m. Up to four visitors are allowed at one time in a patient room. The visitors may rotate out with other people during the day.  One visitor age 86 or older may stay with the patient overnight and must be in the room by 8 p.m.   Merchandiser, retail to address health-related social needs:  [email or web address]     Preparing for Surgery with CHLORHEXIDINE GLUCONATE (CHG) Soap  Chlorhexidine Gluconate (CHG) Soap  o An antiseptic cleaner that kills germs and bonds with the skin to continue killing germs even after washing  o Used for showering the night before surgery and morning of surgery  Before surgery, you can play an important role by reducing the number of germs on your skin.  CHG (Chlorhexidine gluconate) soap is an antiseptic cleanser which kills germs and bonds with the skin to continue killing germs even after washing.  Please do not use if you have an allergy to CHG or antibacterial soaps. If your skin becomes reddened/irritated stop using the CHG.  1. Shower the NIGHT BEFORE SURGERY and the MORNING OF SURGERY with CHG soap.  2. If you choose to wash your hair, wash your hair first as usual with your normal shampoo.  3. After shampooing, rinse your hair and body thoroughly to remove the shampoo.  4. Use CHG as you would any other liquid soap. You can apply CHG directly to the skin and wash gently with a  scrungie or a clean washcloth.  5. Apply the CHG soap to your body only from the neck down. Do not use on open wounds or open sores. Avoid contact with your eyes, ears, mouth, and genitals (private parts). Wash face and genitals (private parts) with your normal soap.  6. Wash thoroughly, paying special attention to the area where your surgery will be performed.  7. Thoroughly rinse your body with warm water.  8. Do not shower/wash with your normal soap after using and rinsing off the CHG soap.  9. Pat yourself dry with a clean towel.  10. Wear clean pajamas to bed the night before surgery.  12. Place clean sheets on your bed the night of your first shower and do not sleep with pets.  13. Shower again with the CHG soap on the day of surgery prior to arriving at the hospital.  14. Do not apply any deodorants/lotions/powders.  15. Please wear clean clothes to the hospital.

## 2024-01-16 NOTE — H&P (Signed)
 Linda Shaffer is a 48 y.o. female here for Rf Dr Sionne George-fibroids . History of Present Illness Linda Shaffer is a 48 year old female with heavy menstrual bleeding and anemia who presents for evaluation of prolonged and heavy menstrual periods.   For the past six to eight months, she has experienced prolonged and heavy menstrual periods, with each period lasting up to two weeks. The most recent period was from May 28 to June 11, and she notes the presence of clots, describing them as 'awful.' Previously, her menstrual cycles were irregular and heavy but not as prolonged.   She experiences significant fatigue and pain during her menstrual cycles. Her blood count and iron stores are low, and she is currently on iron replacement therapy. A recent ultrasound revealed the presence of multiple fibroids.   Her past medical history includes well-controlled diabetes with a recent A1c of 6.1 and hypertension for which she is on medication. She has a history of two pregnancies, resulting in one child delivered via C-section. She previously used the Mirena IUD, which she found effective, but it was removed approximately ten years ago.   No history of heart or lung disease.     Past Medical History:  has a past medical history of Allergic state, Anemia (2023), Depression (2016), Diabetes mellitus without complication (CMS/HHS-HCC) (2018), Fibroid (12/2023), GERD (gastroesophageal reflux disease) (12/14/16), Hypertension in pregnancy (HHS-HCC) (2005), and Migraine with vertigo.  Past Surgical History:  has a past surgical history that includes Cesarean section (2005). Family History: family history includes Alcohol abuse in her brother; Alzheimer's disease in her paternal grandfather and paternal grandmother; Breast cancer in her maternal aunt; Breast cancer (age of onset: 52) in her mother; Cancer in her mother; Coronary Artery Disease (Blocked arteries around heart) in her maternal grandfather, maternal  grandmother, and mother; Coronary Artery Disease (Blocked arteries around heart) (age of onset: 30) in her brother; Diabetes type II in her mother; High blood pressure (Hypertension) in her mother; Hyperlipidemia (Elevated cholesterol) in her brother and mother; No Known Problems in her father; Osteoarthritis in her mother; Stroke in her mother, paternal grandfather, and paternal grandmother; Thyroid disease in her mother. Social History:  reports that she has never smoked. She has never used smokeless tobacco. She reports current alcohol use of about 2.0 standard drinks of alcohol per week. She reports that she does not use drugs. OB/GYN History:  OB History       Gravida  2   Para  1   Term  1   Preterm      AB  1   Living  1        SAB      IAB      Ectopic      Molar      Multiple      Live Births  1             Allergies: is allergic to peanut. Medications:  Current Medications    Current Outpatient Medications:    ACCU-CHEK SOFTCLIX LANCETS lancets, 1 each once daily Use as instructed., Disp: , Rfl:    blood glucose diagnostic (ACCU-CHEK GUIDE TEST STRIPS) test strip, once daily Use as instructed., Disp: 50 strip, Rfl: 11   blood glucose meter kit, as directed, Disp: 1 each, Rfl: 3   EPINEPHrine (EPIPEN) 0.3 mg/0.3 mL auto-injector, Inject 0.3 mg into the muscle once as needed, Disp: , Rfl:    losartan (COZAAR) 25 MG tablet, Take 1 tablet (  25 mg total) by mouth 2 (two) times daily, Disp: 200 tablet, Rfl: 3   metFORMIN (GLUCOPHAGE) 500 MG tablet, Take 1 tablet (500 mg total) by mouth daily with breakfast, Disp: 90 tablet, Rfl: 3   metoprolol TARTrate (LOPRESSOR) 25 MG tablet, TAKE 1 TABLET BY MOUTH TWICE A DAY, Disp: 180 tablet, Rfl: 3   pravastatin (PRAVACHOL) 40 MG tablet, TAKE 1 TABLET BY MOUTH EVERYDAY AT BEDTIME, Disp: 100 tablet, Rfl: 3   valACYclovir (VALTREX) 500 MG tablet, Take 1 tablet (500 mg total) by mouth once daily, Disp: 90 tablet, Rfl: 3    blood sugar diagnostic, disc (BLOOD GLUCOSE DIAGNOSTIC, DISC) test strip, Use once daily Use as instructed., Disp: 100 each, Rfl: 12   cyanocobalamin (VITAMIN B12) 1000 MCG tablet, Take 1,000 mcg by mouth once daily., Disp: , Rfl:    ergocalciferol, vitamin D2, (VITAMIN D2 ORAL), Take by mouth, Disp: , Rfl:    lancing device with lancets kit, Use 1 each once daily Use as instructed., Disp: 100 each, Rfl: 12   sodium, potassium, and magnesium (SUPREP) oral solution, Take 2 Bottles (1 kit total) by mouth as directed One kit contains 2 bottles.  Take both bottles at the times instructed by your provider., Disp: 354 mL, Rfl: 0     Review of Systems: General:                      No fatigue or weight loss Eyes:                           No vision changes Ears:                            No hearing difficulty Respiratory:                No cough or shortness of breath Pulmonary:                  No asthma or shortness of breath Cardiovascular:           No chest pain, palpitations, dyspnea on exertion Gastrointestinal:          No abdominal bloating, chronic diarrhea, constipations, masses, pain or hematochezia Genitourinary:             No hematuria, dysuria, abnormal vaginal discharge, pelvic pain, Menometrorrhagia Lymphatic:                   No swollen lymph nodes Musculoskeletal:No muscle weakness Neurologic:                  No extremity weakness, syncope, seizure disorder Psychiatric:                  No history of depression, delusions or suicidal/homicidal ideation      Exam:       Vitals:    01/16/24 1117  BP: (!) 167/92  Pulse: 72      Body mass index is 40.6 kg/m.   WDWN white/  female in NAD   Lungs: CTA  CV : RRR without murmur   Neck:  no thyromegaly Abdomen: soft , no mass, normal active bowel sounds,  non-tender, no rebound tenderness Pelvic: tanner stage 5 ,  External genitalia: vulva /labia no lesions Urethra: no prolapse Vagina: normal physiologic d/c Cervix:  no lesions, no cervical motion tenderness   Uterus: normal  size shape and contour, non-tender Adnexa: no mass,  non-tender   Rectovaginal:  Pelvic exam done to collect PAP Chaperone present   Impression:    The primary encounter diagnosis was Menorrhagia with irregular cycle. Diagnoses of Intramural leiomyoma of uterus and Iron deficiency anemia due to chronic blood loss were also pertinent to this visit.       Plan:  After discussion of option ocps , IUD , lysteda  a, she has elected to proceed with TAH , bilateral salpingectomy  Benefits and risks to surgery: The proposed benefit of the surgery has been discussed with the patient. The possible risks include, but are not limited to: organ injury to the bowel , bladder, ureters, and major blood vessels and nerves. There is a possibility of additional surgeries resulting from these injuries. There is also the risk of blood transfusion and the need to receive blood products during or after the procedure which may rarely lead to HIV or Hepatitis C infection. There is a risk of developing a deep venous thrombosis or a pulmonary embolism . There is the possibility of wound infection and also anesthetic complications, even the rare possibility of death. The patient understands these risks and wishes to proceed. All questions have been answered and the consent has been signed.

## 2024-01-20 ENCOUNTER — Inpatient Hospital Stay: Admission: RE | Admit: 2024-01-20 | Source: Ambulatory Visit

## 2024-01-21 ENCOUNTER — Encounter
Admission: RE | Disposition: A | Discharge status: Home / Self Care | Payer: Self-pay | Source: Home / Self Care | Attending: Obstetrics and Gynecology

## 2024-01-21 ENCOUNTER — Inpatient Hospital Stay: Payer: Self-pay | Admitting: Urgent Care

## 2024-01-21 ENCOUNTER — Inpatient Hospital Stay

## 2024-01-21 ENCOUNTER — Inpatient Hospital Stay
Admission: RE | Admit: 2024-01-21 | Discharge: 2024-01-23 | DRG: 743 | Disposition: A | Discharge status: Home / Self Care | Attending: Obstetrics and Gynecology | Admitting: Obstetrics and Gynecology

## 2024-01-21 ENCOUNTER — Encounter: Payer: Self-pay | Admitting: Obstetrics and Gynecology

## 2024-01-21 ENCOUNTER — Other Ambulatory Visit: Payer: Self-pay

## 2024-01-21 DIAGNOSIS — D649 Anemia, unspecified: Secondary | ICD-10-CM | POA: Diagnosis present

## 2024-01-21 DIAGNOSIS — Z7984 Long term (current) use of oral hypoglycemic drugs: Secondary | ICD-10-CM | POA: Diagnosis not present

## 2024-01-21 DIAGNOSIS — Z79899 Other long term (current) drug therapy: Secondary | ICD-10-CM | POA: Diagnosis not present

## 2024-01-21 DIAGNOSIS — E66813 Obesity, class 3: Secondary | ICD-10-CM | POA: Diagnosis present

## 2024-01-21 DIAGNOSIS — N3289 Other specified disorders of bladder: Secondary | ICD-10-CM

## 2024-01-21 DIAGNOSIS — I10 Essential (primary) hypertension: Secondary | ICD-10-CM | POA: Diagnosis present

## 2024-01-21 DIAGNOSIS — N920 Excessive and frequent menstruation with regular cycle: Secondary | ICD-10-CM | POA: Diagnosis present

## 2024-01-21 DIAGNOSIS — Z01818 Encounter for other preprocedural examination: Secondary | ICD-10-CM

## 2024-01-21 DIAGNOSIS — Z1152 Encounter for screening for COVID-19: Secondary | ICD-10-CM | POA: Diagnosis not present

## 2024-01-21 DIAGNOSIS — D500 Iron deficiency anemia secondary to blood loss (chronic): Secondary | ICD-10-CM | POA: Diagnosis present

## 2024-01-21 DIAGNOSIS — Z8249 Family history of ischemic heart disease and other diseases of the circulatory system: Secondary | ICD-10-CM | POA: Diagnosis not present

## 2024-01-21 DIAGNOSIS — N921 Excessive and frequent menstruation with irregular cycle: Secondary | ICD-10-CM | POA: Diagnosis present

## 2024-01-21 DIAGNOSIS — N926 Irregular menstruation, unspecified: Secondary | ICD-10-CM | POA: Diagnosis present

## 2024-01-21 DIAGNOSIS — D219 Benign neoplasm of connective and other soft tissue, unspecified: Principal | ICD-10-CM

## 2024-01-21 DIAGNOSIS — D251 Intramural leiomyoma of uterus: Secondary | ICD-10-CM | POA: Diagnosis present

## 2024-01-21 DIAGNOSIS — Z6839 Body mass index (BMI) 39.0-39.9, adult: Secondary | ICD-10-CM

## 2024-01-21 DIAGNOSIS — Z9889 Other specified postprocedural states: Secondary | ICD-10-CM

## 2024-01-21 DIAGNOSIS — R739 Hyperglycemia, unspecified: Secondary | ICD-10-CM

## 2024-01-21 HISTORY — PX: CYSTOSCOPY: SHX5120

## 2024-01-21 HISTORY — PX: HYSTERECTOMY ABDOMINAL WITH SALPINGECTOMY: SHX6725

## 2024-01-21 LAB — CBC
HCT: 34.3 % — ABNORMAL LOW (ref 36.0–46.0)
Hemoglobin: 10.1 g/dL — ABNORMAL LOW (ref 12.0–15.0)
MCH: 23.3 pg — ABNORMAL LOW (ref 26.0–34.0)
MCHC: 29.4 g/dL — ABNORMAL LOW (ref 30.0–36.0)
MCV: 79 fL — ABNORMAL LOW (ref 80.0–100.0)
Platelets: 391 K/uL (ref 150–400)
RBC: 4.34 MIL/uL (ref 3.87–5.11)
RDW: 21.1 % — ABNORMAL HIGH (ref 11.5–15.5)
WBC: 6.9 K/uL (ref 4.0–10.5)
nRBC: 0 % (ref 0.0–0.2)

## 2024-01-21 LAB — BASIC METABOLIC PANEL WITH GFR
Anion gap: 13 (ref 5–15)
BUN: 12 mg/dL (ref 6–20)
CO2: 22 mmol/L (ref 22–32)
Calcium: 9.2 mg/dL (ref 8.9–10.3)
Chloride: 106 mmol/L (ref 98–111)
Creatinine, Ser: 0.66 mg/dL (ref 0.44–1.00)
GFR, Estimated: >60 mL/min (ref >60)
Glucose, Bld: 128 mg/dL — ABNORMAL HIGH (ref 70–99)
Potassium: 3.5 mmol/L (ref 3.5–5.1)
Sodium: 141 mmol/L (ref 135–145)

## 2024-01-21 LAB — POCT PREGNANCY, URINE: Preg Test, Ur: NEGATIVE

## 2024-01-21 LAB — GLUCOSE, CAPILLARY
Glucose-Capillary: 111 mg/dL — ABNORMAL HIGH (ref 70–99)
Glucose-Capillary: 123 mg/dL — ABNORMAL HIGH (ref 70–99)
Glucose-Capillary: 128 mg/dL — ABNORMAL HIGH (ref 70–99)
Glucose-Capillary: 130 mg/dL — ABNORMAL HIGH (ref 70–99)

## 2024-01-21 LAB — ABO/RH: ABO/RH(D): B POS

## 2024-01-21 SURGERY — HYSTERECTOMY, TOTAL, ABDOMINAL, WITH SALPINGECTOMY
Anesthesia: General | Site: Bladder

## 2024-01-21 MED ORDER — OXYCODONE HCL 5 MG PO TABS: 5.0000 mg | ORAL_TABLET | Freq: Once | ORAL | Status: DC | PRN

## 2024-01-21 MED ORDER — ONDANSETRON HCL 4 MG/2ML IJ SOLN
4.0000 mg | Freq: Four times a day (QID) | INTRAMUSCULAR | Status: DC | PRN
Start: 2024-01-21 — End: 2024-01-22

## 2024-01-21 MED ORDER — KETOROLAC TROMETHAMINE 30 MG/ML IJ SOLN
30.0000 mg | Freq: Four times a day (QID) | INTRAMUSCULAR | Status: AC
  Administered 2024-01-21 – 2024-01-22 (×4): 30 mg via INTRAVENOUS
  Filled 2024-01-21 (×4): qty 1

## 2024-01-21 MED ORDER — 0.9 % SODIUM CHLORIDE (POUR BTL) OPTIME
TOPICAL | Status: DC | PRN
  Administered 2024-01-21: 100 mL
  Administered 2024-01-21: 500 mL

## 2024-01-21 MED ORDER — GABAPENTIN 300 MG PO CAPS
ORAL_CAPSULE | ORAL | Status: AC
  Filled 2024-01-21: qty 1

## 2024-01-21 MED ORDER — KETOROLAC TROMETHAMINE 30 MG/ML IJ SOLN
INTRAMUSCULAR | Status: DC | PRN
  Administered 2024-01-21: 30 mg via INTRAVENOUS

## 2024-01-21 MED ORDER — PROPOFOL 10 MG/ML IV BOLUS
INTRAVENOUS | Status: AC
  Filled 2024-01-21: qty 20

## 2024-01-21 MED ORDER — GABAPENTIN 300 MG PO CAPS
300.0000 mg | ORAL_CAPSULE | ORAL | Status: AC
  Administered 2024-01-21: 300 mg via ORAL

## 2024-01-21 MED ORDER — ACETAMINOPHEN 500 MG PO TABS
1000.0000 mg | ORAL_TABLET | Freq: Four times a day (QID) | ORAL | Status: DC
  Administered 2024-01-21 – 2024-01-23 (×7): 1000 mg via ORAL
  Filled 2024-01-21 (×8): qty 2

## 2024-01-21 MED ORDER — NALOXONE HCL 0.4 MG/ML IJ SOLN
0.4000 mg | INTRAMUSCULAR | Status: DC | PRN
Start: 2024-01-21 — End: 2024-01-22

## 2024-01-21 MED ORDER — ORAL CARE MOUTH RINSE: 15.0000 mL | Freq: Once | OROMUCOSAL | Status: AC

## 2024-01-21 MED ORDER — ACETAMINOPHEN 500 MG PO TABS
ORAL_TABLET | ORAL | Status: AC
  Filled 2024-01-21: qty 2

## 2024-01-21 MED ORDER — SODIUM CHLORIDE 0.9 % IV SOLN: INTRAVENOUS | Status: DC | PRN

## 2024-01-21 MED ORDER — SODIUM CHLORIDE 0.9% FLUSH: 3.0000 mL | Freq: Two times a day (BID) | INTRAVENOUS | Status: DC

## 2024-01-21 MED ORDER — METHYLENE BLUE (ANTIDOTE) 1 % IV SOLN
INTRAVENOUS | Status: AC
  Filled 2024-01-21: qty 10

## 2024-01-21 MED ORDER — PROPOFOL 1000 MG/100ML IV EMUL
INTRAVENOUS | Status: AC
  Filled 2024-01-21: qty 100

## 2024-01-21 MED ORDER — ACETAMINOPHEN 500 MG PO TABS
1000.0000 mg | ORAL_TABLET | ORAL | Status: AC
  Administered 2024-01-21: 1000 mg via ORAL

## 2024-01-21 MED ORDER — HYDROMORPHONE HCL 1 MG/ML IJ SOLN
INTRAMUSCULAR | Status: AC
  Filled 2024-01-21: qty 1

## 2024-01-21 MED ORDER — ONDANSETRON HCL 4 MG/2ML IJ SOLN
INTRAMUSCULAR | Status: DC | PRN
  Administered 2024-01-21: 4 mg via INTRAVENOUS

## 2024-01-21 MED ORDER — MORPHINE SULFATE 1 MG/ML IV SOLN PCA
INTRAVENOUS | Status: DC
  Administered 2024-01-21: 5.66 mg via INTRAVENOUS
  Administered 2024-01-21: 6.56 mg via INTRAVENOUS
  Administered 2024-01-22: 4.49 mg via INTRAVENOUS
  Administered 2024-01-22: 5.33 mg via INTRAVENOUS
  Filled 2024-01-21: qty 30

## 2024-01-21 MED ORDER — BUPIVACAINE HCL 0.5 % IJ SOLN
INTRAMUSCULAR | Status: DC | PRN
Start: 2024-01-21 — End: 2024-01-21
  Administered 2024-01-21: 30 mL

## 2024-01-21 MED ORDER — CEFAZOLIN SODIUM-DEXTROSE 2-4 GM/100ML-% IV SOLN
INTRAVENOUS | Status: AC
  Filled 2024-01-21: qty 100

## 2024-01-21 MED ORDER — IBUPROFEN 600 MG PO TABS: 600.0000 mg | ORAL_TABLET | Freq: Four times a day (QID) | ORAL | Status: DC

## 2024-01-21 MED ORDER — DEXMEDETOMIDINE HCL IN NACL 80 MCG/20ML IV SOLN
INTRAVENOUS | Status: DC | PRN
  Administered 2024-01-21: 12 ug via INTRAVENOUS
  Administered 2024-01-21: 8 ug via INTRAVENOUS

## 2024-01-21 MED ORDER — BUPIVACAINE HCL (PF) 0.5 % IJ SOLN
INTRAMUSCULAR | Status: AC
  Filled 2024-01-21: qty 30

## 2024-01-21 MED ORDER — CEFAZOLIN SODIUM-DEXTROSE 2-4 GM/100ML-% IV SOLN
2.0000 g | Freq: Once | INTRAVENOUS | Status: AC
  Administered 2024-01-21: 2 g via INTRAVENOUS
  Administered 2024-01-21: 1 g via INTRAVENOUS

## 2024-01-21 MED ORDER — SODIUM CHLORIDE 0.9 % IV SOLN: INTRAVENOUS | Status: DC

## 2024-01-21 MED ORDER — HYDROMORPHONE HCL 1 MG/ML IJ SOLN
0.2500 mg | INTRAMUSCULAR | Status: DC | PRN
  Administered 2024-01-21 (×2): 0.5 mg via INTRAVENOUS

## 2024-01-21 MED ORDER — ALBUMIN HUMAN 5 % IV SOLN
INTRAVENOUS | Status: AC
  Filled 2024-01-21: qty 250

## 2024-01-21 MED ORDER — CHLORHEXIDINE GLUCONATE 0.12 % MT SOLN
15.0000 mL | Freq: Once | OROMUCOSAL | Status: AC
  Administered 2024-01-21: 15 mL via OROMUCOSAL

## 2024-01-21 MED ORDER — METOPROLOL TARTRATE 50 MG PO TABS
25.0000 mg | ORAL_TABLET | Freq: Two times a day (BID) | ORAL | Status: DC
  Administered 2024-01-21 – 2024-01-23 (×3): 25 mg via ORAL
  Filled 2024-01-21 (×5): qty 0.5

## 2024-01-21 MED ORDER — METRONIDAZOLE 500 MG/100ML IV SOLN
500.0000 mg | Freq: Once | INTRAVENOUS | Status: AC
  Administered 2024-01-21: 500 mg via INTRAVENOUS
  Filled 2024-01-21 (×2): qty 100

## 2024-01-21 MED ORDER — FUROSEMIDE 10 MG/ML IJ SOLN
INTRAMUSCULAR | Status: AC
  Filled 2024-01-21: qty 4

## 2024-01-21 MED ORDER — MIDAZOLAM HCL 2 MG/2ML IJ SOLN
INTRAMUSCULAR | Status: AC
  Filled 2024-01-21: qty 2

## 2024-01-21 MED ORDER — STERILE WATER FOR IRRIGATION IR SOLN
Status: DC | PRN
  Administered 2024-01-21: 4000 mL

## 2024-01-21 MED ORDER — OXYCODONE HCL 5 MG/5ML PO SOLN: 5.0000 mg | Freq: Once | ORAL | Status: DC | PRN

## 2024-01-21 MED ORDER — METHYLENE BLUE (ANTIDOTE) 1 % IV SOLN
INTRAVENOUS | Status: DC | PRN
  Administered 2024-01-21: 10 mg via INTRAVENOUS

## 2024-01-21 MED ORDER — ONDANSETRON HCL 4 MG PO TABS: 4.0000 mg | ORAL_TABLET | Freq: Four times a day (QID) | ORAL | Status: DC | PRN

## 2024-01-21 MED ORDER — GABAPENTIN 300 MG PO CAPS
300.0000 mg | ORAL_CAPSULE | Freq: Every day | ORAL | Status: DC
  Administered 2024-01-21 – 2024-01-22 (×2): 300 mg via ORAL
  Filled 2024-01-21 (×2): qty 1

## 2024-01-21 MED ORDER — LACTATED RINGERS IV SOLN: INTRAVENOUS | Status: AC

## 2024-01-21 MED ORDER — FLUORESCEIN SODIUM 10 % IV SOLN
INTRAVENOUS | Status: AC
  Filled 2024-01-21: qty 5

## 2024-01-21 MED ORDER — DIPHENHYDRAMINE HCL 50 MG/ML IJ SOLN: 12.5000 mg | Freq: Four times a day (QID) | INTRAMUSCULAR | Status: DC | PRN

## 2024-01-21 MED ORDER — CHLORHEXIDINE GLUCONATE 0.12 % MT SOLN
OROMUCOSAL | Status: AC
  Filled 2024-01-21: qty 15

## 2024-01-21 MED ORDER — SUGAMMADEX SODIUM 200 MG/2ML IV SOLN
INTRAVENOUS | Status: DC | PRN
  Administered 2024-01-21: 200 mg via INTRAVENOUS

## 2024-01-21 MED ORDER — METFORMIN HCL ER 500 MG PO TB24
500.0000 mg | ORAL_TABLET | Freq: Every day | ORAL | Status: DC
  Administered 2024-01-22 – 2024-01-23 (×2): 500 mg via ORAL
  Filled 2024-01-21 (×2): qty 1

## 2024-01-21 MED ORDER — FENTANYL CITRATE (PF) 100 MCG/2ML IJ SOLN
INTRAMUSCULAR | Status: AC
Start: 2024-01-21 — End: 2024-01-21
  Filled 2024-01-21: qty 2

## 2024-01-21 MED ORDER — ONDANSETRON HCL 4 MG/2ML IJ SOLN: 4.0000 mg | Freq: Four times a day (QID) | INTRAMUSCULAR | Status: DC | PRN

## 2024-01-21 MED ORDER — DEXAMETHASONE SODIUM PHOSPHATE 10 MG/ML IJ SOLN
INTRAMUSCULAR | Status: AC
Start: 2024-01-21 — End: 2024-01-21
  Filled 2024-01-21: qty 1

## 2024-01-21 MED ORDER — ROCURONIUM BROMIDE 100 MG/10ML IV SOLN
INTRAVENOUS | Status: DC | PRN
  Administered 2024-01-21: 60 mg via INTRAVENOUS

## 2024-01-21 MED ORDER — ROCURONIUM BROMIDE 10 MG/ML (PF) SYRINGE
PREFILLED_SYRINGE | INTRAVENOUS | Status: AC
  Filled 2024-01-21: qty 10

## 2024-01-21 MED ORDER — FLUORESCEIN SODIUM 10 % IV SOLN
INTRAVENOUS | Status: DC | PRN
  Administered 2024-01-21: 50 mg via INTRAVENOUS

## 2024-01-21 MED ORDER — PHENYLEPHRINE 80 MCG/ML (10ML) SYRINGE FOR IV PUSH (FOR BLOOD PRESSURE SUPPORT)
PREFILLED_SYRINGE | INTRAVENOUS | Status: AC
  Filled 2024-01-21: qty 10

## 2024-01-21 MED ORDER — FENTANYL CITRATE (PF) 100 MCG/2ML IJ SOLN
INTRAMUSCULAR | Status: DC | PRN
  Administered 2024-01-21 (×3): 50 ug via INTRAVENOUS

## 2024-01-21 MED ORDER — LOSARTAN POTASSIUM 25 MG PO TABS
25.0000 mg | ORAL_TABLET | Freq: Two times a day (BID) | ORAL | Status: DC
  Administered 2024-01-21 – 2024-01-23 (×4): 25 mg via ORAL
  Filled 2024-01-21 (×5): qty 1

## 2024-01-21 MED ORDER — DEXAMETHASONE SODIUM PHOSPHATE 10 MG/ML IJ SOLN
INTRAMUSCULAR | Status: DC | PRN
  Administered 2024-01-21: 10 mg via INTRAVENOUS

## 2024-01-21 MED ORDER — LIDOCAINE HCL (CARDIAC) PF 100 MG/5ML IV SOSY
PREFILLED_SYRINGE | INTRAVENOUS | Status: DC | PRN
  Administered 2024-01-21: 100 mg via INTRAVENOUS

## 2024-01-21 MED ORDER — SODIUM CHLORIDE 0.9% FLUSH: 3.0000 mL | INTRAVENOUS | Status: DC | PRN

## 2024-01-21 MED ORDER — POVIDONE-IODINE 10 % EX SWAB
2.0000 | Freq: Once | CUTANEOUS | Status: AC
  Administered 2024-01-21: 2 via TOPICAL

## 2024-01-21 MED ORDER — DIPHENHYDRAMINE HCL 12.5 MG/5ML PO ELIX: 12.5000 mg | ORAL_SOLUTION | Freq: Four times a day (QID) | ORAL | Status: DC | PRN

## 2024-01-21 MED ORDER — PHENYLEPHRINE 80 MCG/ML (10ML) SYRINGE FOR IV PUSH (FOR BLOOD PRESSURE SUPPORT)
PREFILLED_SYRINGE | INTRAVENOUS | Status: DC | PRN
  Administered 2024-01-21: 80 ug via INTRAVENOUS

## 2024-01-21 MED ORDER — SEVOFLURANE IN SOLN
RESPIRATORY_TRACT | Status: AC
Start: 2024-01-21 — End: 2024-01-21
  Filled 2024-01-21: qty 250

## 2024-01-21 MED ORDER — VASOPRESSIN 20 UNIT/ML IV SOLN: INTRAVENOUS | Status: DC | PRN

## 2024-01-21 MED ORDER — SIMETHICONE 80 MG PO CHEW: 80.0000 mg | CHEWABLE_TABLET | Freq: Four times a day (QID) | ORAL | Status: DC | PRN

## 2024-01-21 MED ORDER — MIDAZOLAM HCL 2 MG/2ML IJ SOLN
INTRAMUSCULAR | Status: DC | PRN
  Administered 2024-01-21: 2 mg via INTRAVENOUS

## 2024-01-21 MED ORDER — LACTATED RINGERS IV SOLN: INTRAVENOUS | Status: DC

## 2024-01-21 MED ORDER — ONDANSETRON HCL 4 MG/2ML IJ SOLN
INTRAMUSCULAR | Status: AC
Start: 2024-01-21 — End: 2024-01-21
  Filled 2024-01-21: qty 2

## 2024-01-21 MED ORDER — FUROSEMIDE 10 MG/ML IJ SOLN
INTRAMUSCULAR | Status: DC | PRN
Start: 2024-01-21 — End: 2024-01-21
  Administered 2024-01-21: 10 mg via INTRAMUSCULAR

## 2024-01-21 MED ORDER — VASOPRESSIN 20 UNIT/ML IV SOLN
INTRAVENOUS | Status: AC
  Filled 2024-01-21: qty 1

## 2024-01-21 MED ORDER — SODIUM CHLORIDE 0.9% FLUSH: 9.0000 mL | INTRAVENOUS | Status: DC | PRN

## 2024-01-21 MED ORDER — LIDOCAINE HCL (PF) 2 % IJ SOLN
INTRAMUSCULAR | Status: AC
  Filled 2024-01-21: qty 5

## 2024-01-21 MED ORDER — PROPOFOL 10 MG/ML IV BOLUS
INTRAVENOUS | Status: DC | PRN
  Administered 2024-01-21: 125 ug/kg/min via INTRAVENOUS
  Administered 2024-01-21: 150 mg via INTRAVENOUS
  Administered 2024-01-21: 50 mg via INTRAVENOUS

## 2024-01-21 MED ORDER — SODIUM CHLORIDE (PF) 0.9 % IJ SOLN
INTRAMUSCULAR | Status: AC
  Filled 2024-01-21: qty 50

## 2024-01-21 SURGICAL SUPPLY — 52 items
BLADE SURG SZ10 CARB STEEL (BLADE) IMPLANT
CATH URETL OPEN 5X70 (CATHETERS) IMPLANT
CHLORAPREP W/TINT 26 (MISCELLANEOUS) ×3 IMPLANT
CNTNR URN SCR LID CUP LEK RST (MISCELLANEOUS) ×3 IMPLANT
DRAPE C-ARM XRAY 36X54 (DRAPES) IMPLANT
DRAPE LAP W/FLUID (DRAPES) ×3 IMPLANT
DRAPE UNDER BUTTOCK W/FLU (DRAPES) ×3 IMPLANT
DRSG TELFA 3X8 NADH STRL (GAUZE/BANDAGES/DRESSINGS) ×3 IMPLANT
ELECT BLADE 6.5 EXT (BLADE) IMPLANT
ELECTRODE REM PT RTRN 9FT ADLT (ELECTROSURGICAL) ×3 IMPLANT
GAUZE 4X4 16PLY ~~LOC~~+RFID DBL (SPONGE) ×6 IMPLANT
GAUZE SPONGE 4X4 12PLY STRL (GAUZE/BANDAGES/DRESSINGS) ×3 IMPLANT
GLOVE BIO SURGEON STRL SZ7 (GLOVE) ×3 IMPLANT
GLOVE BIOGEL PI IND STRL 6.5 (GLOVE) ×3 IMPLANT
GLOVE SURG SYN 8.0 (GLOVE) ×3 IMPLANT
GLOVE SURG SYN 8.0 PF PI (GLOVE) ×3 IMPLANT
GOWN STRL REUS W/ TWL LRG LVL3 (GOWN DISPOSABLE) ×6 IMPLANT
GOWN STRL REUS W/ TWL XL LVL3 (GOWN DISPOSABLE) ×3 IMPLANT
GRADUATE 1200CC STRL 31836 (MISCELLANEOUS) IMPLANT
IV NS 1000ML BAXH (IV SOLUTION) IMPLANT
KIT TURNOVER CYSTO (KITS) ×3 IMPLANT
LABEL OR SOLS (LABEL) ×3 IMPLANT
MANIFOLD NEPTUNE II (INSTRUMENTS) ×3 IMPLANT
NDL HYPO 22X1.5 SAFETY MO (MISCELLANEOUS) ×6 IMPLANT
NDL SAFETY ECLIPSE 18X1.5 (NEEDLE) IMPLANT
NEEDLE HYPO 22X1.5 SAFETY MO (MISCELLANEOUS) ×3 IMPLANT
NS IRRIG 500ML POUR BTL (IV SOLUTION) IMPLANT
PACK BASIN MAJOR ARMC (MISCELLANEOUS) ×3 IMPLANT
RETAINER VISCERA MED (MISCELLANEOUS) IMPLANT
SET CYSTO W/LG BORE CLAMP LF (SET/KITS/TRAYS/PACK) IMPLANT
SOLUTION PREP PVP 2OZ (MISCELLANEOUS) ×3 IMPLANT
SPONGE T-LAP 18X18 ~~LOC~~+RFID (SPONGE) ×6 IMPLANT
STAPLER INSORB 30 2030 C-SECTI (MISCELLANEOUS) IMPLANT
STAPLER SKIN PROX 35W (STAPLE) IMPLANT
SURGILUBE 2OZ TUBE FLIPTOP (MISCELLANEOUS) ×3 IMPLANT
SUT CHROMIC 2 0 CT 1 (SUTURE) ×9 IMPLANT
SUT PDS AB 1 TP1 96 (SUTURE) IMPLANT
SUT VIC AB 0 CT1 27XCR 8 STRN (SUTURE) ×6 IMPLANT
SUT VIC AB 0 CT1 36 (SUTURE) ×6 IMPLANT
SUT VIC AB 2-0 SH 27XBRD (SUTURE) IMPLANT
SUT VIC AB 3-0 SH 27X BRD (SUTURE) ×3 IMPLANT
SYR 10ML LL (SYRINGE) IMPLANT
SYR 20ML LL LF (SYRINGE) ×6 IMPLANT
SYR 30ML LL (SYRINGE) ×3 IMPLANT
SYR 3ML LL SCALE MARK (SYRINGE) IMPLANT
SYR BULB IRRIG 60ML STRL (SYRINGE) ×3 IMPLANT
TAPE TRANSPORE STRL 2 31045 (GAUZE/BANDAGES/DRESSINGS) IMPLANT
TRAP FLUID SMOKE EVACUATOR (MISCELLANEOUS) ×3 IMPLANT
TRAY FOLEY MTR SLVR 16FR STAT (SET/KITS/TRAYS/PACK) ×3 IMPLANT
TUBING CONNECTING 10 (TUBING) IMPLANT
WATER STERILE IRR 1000ML POUR (IV SOLUTION) ×3 IMPLANT
WATER STERILE IRR 500ML POUR (IV SOLUTION) ×3 IMPLANT

## 2024-01-21 NOTE — Transfer of Care (Signed)
 Immediate Anesthesia Transfer of Care Note  Patient: Linda Shaffer  Procedure(s) Performed: HYSTERECTOMY, TOTAL, ABDOMINAL, WITH SALPINGECTOMY (Bilateral: Abdomen) CYSTOSCOPY (Bladder)  Patient Location: PACU  Anesthesia Type:General  Level of Consciousness: drowsy  Airway & Oxygen Therapy: Patient Spontanous Breathing and Patient connected to face mask oxygen  Post-op Assessment: Report given to RN and Post -op Vital signs reviewed and stable  Post vital signs: Reviewed and stable  Last Vitals:  Vitals Value Taken Time  BP    Temp    Pulse 82 01/21/24 12:13  Resp 25 01/21/24 12:13  SpO2 100 % 01/21/24 12:13  Vitals shown include unfiled device data.  Last Pain:  Vitals:   01/21/24 0621  TempSrc: Temporal  PainSc: 0-No pain         Complications: There were no known notable events for this encounter.

## 2024-01-21 NOTE — Anesthesia Preprocedure Evaluation (Signed)
 Anesthesia Evaluation  Patient identified by MRN, date of birth, ID band Patient awake    Reviewed: Allergy & Precautions, NPO status , Patient's Chart, lab work & pertinent test results  History of Anesthesia Complications Negative for: history of anesthetic complications  Airway Mallampati: II  TM Distance: >3 FB Neck ROM: full    Dental no notable dental hx.    Pulmonary neg pulmonary ROS   Pulmonary exam normal        Cardiovascular hypertension, On Medications negative cardio ROS Normal cardiovascular exam     Neuro/Psych negative neurological ROS  negative psych ROS   GI/Hepatic negative GI ROS, Neg liver ROS,,,  Endo/Other  diabetes, Type 2, Oral Hypoglycemic Agents  Class 3 obesity  Renal/GU      Musculoskeletal   Abdominal   Peds  Hematology  (+) Blood dyscrasia, anemia   Anesthesia Other Findings Past Medical History: No date: Anemia No date: Diabetes mellitus without complication (HCC) No date: Hypercholesteremia No date: Hypertension  Past Surgical History: No date: CESAREAN SECTION No date: COLONOSCOPY No date: DENTAL SURGERY  BMI    Body Mass Index: 39.33 kg/m      Reproductive/Obstetrics negative OB ROS                              Anesthesia Physical Anesthesia Plan  ASA: 3  Anesthesia Plan: General ETT   Post-op Pain Management: Tylenol  PO (pre-op)*, Gabapentin  PO (pre-op)*, Toradol  IV (intra-op)*, Dilaudid  IV and Ketamine IV*   Induction: Intravenous  PONV Risk Score and Plan: 3 and Ondansetron , Dexamethasone , Midazolam  and Treatment may vary due to age or medical condition  Airway Management Planned: Oral ETT  Additional Equipment:   Intra-op Plan:   Post-operative Plan: Extubation in OR  Informed Consent: I have reviewed the patients History and Physical, chart, labs and discussed the procedure including the risks, benefits and alternatives  for the proposed anesthesia with the patient or authorized representative who has indicated his/her understanding and acceptance.     Dental Advisory Given  Plan Discussed with: Anesthesiologist, CRNA and Surgeon  Anesthesia Plan Comments: (Patient consented for risks of anesthesia including but not limited to:  - adverse reactions to medications - damage to eyes, teeth, lips or other oral mucosa - nerve damage due to positioning  - sore throat or hoarseness - Damage to heart, brain, nerves, lungs, other parts of body or loss of life  Patient voiced understanding and assent.)        Anesthesia Quick Evaluation

## 2024-01-21 NOTE — Anesthesia Postprocedure Evaluation (Signed)
 Anesthesia Post Note  Patient: Linda Shaffer  Procedure(s) Performed: HYSTERECTOMY, TOTAL, ABDOMINAL, WITH SALPINGECTOMY (Bilateral: Abdomen) CYSTOSCOPY (Bladder) CYSTOSCOPY (Bladder)  Patient location during evaluation: PACU Anesthesia Type: General Level of consciousness: awake and alert Pain management: pain level controlled Vital Signs Assessment: post-procedure vital signs reviewed and stable Respiratory status: spontaneous breathing, nonlabored ventilation, respiratory function stable and patient connected to nasal cannula oxygen Cardiovascular status: blood pressure returned to baseline and stable Postop Assessment: no apparent nausea or vomiting Anesthetic complications: no   There were no known notable events for this encounter.   Last Vitals:  Vitals:   01/21/24 1320 01/21/24 1330  BP:  116/72  Pulse:  91  Resp: 18 20  Temp:  36.6 C  SpO2:  93%    Last Pain:  Vitals:   01/21/24 1351  TempSrc:   PainSc: 2                  [name]

## 2024-01-21 NOTE — Op Note (Signed)
 NAMENORABELLE, KONDO MEDICAL RECORD NO: [id number] ACCOUNT NO: [id number] DATE OF BIRTH: 1976/03/29 FACILITY: [facility] LOCATION: [facility] PHYSICIAN: Debby DOROTHA Dinsmore, MD  Operative Report   DATE OF PROCEDURE: 01/21/2024  PREOPERATIVE DIAGNOSES: 1.  Menorrhagia. 2.  Fibroid uterus.  POSTOPERATIVE DIAGNOSES: 1.  Menorrhagia. 2.  Fibroid uterus.  PROCEDURE: 1.  Total abdominal hysterectomy. 2.  Bilateral salpingectomy. 3.  Cystoscopy.  SURGEON:  Debby DOROTHA Dinsmore, MD  FIRST ASSISTANT:  Heather Penton, MD  SECOND ASSISTANT:  PA student, Caryl.  Urology consultant : Dr Twylla  ANESTHESIA:  General endotracheal anesthesia.  INDICATIONS:  A 48 year old gravida 2 para 1 patient with a long history of at least 8 months of heavy menstrual flow uncontrolled by conservative therapies.  The patient was known to have multiple fibroids with a fibroid at least 6 cm in size.  The  patient has opted for definitive surgical intervention.  DESCRIPTION OF PROCEDURE:  After adequate general endotracheal anesthesia, the patient was placed in the dorsal supine position, legs in the [place] stirrups.  A timeout was performed.  The patient did receive 2 g IV Ancef  and 500 mg Flagyl  for surgical  prophylaxis prior to commencement.  A Pfannenstiel incision was made 2 fingerbreadths above the symphysis pubis.  Sharp dissection was used to identify the fascia.  The fascia was opened in the midline and opened in a transverse fashion.  The superior  aspect of the fascia was grasped with Kocher clamps and the recti muscles were dissected free.  The inferior aspect of the fascia was grasped with Kocher clamps and the pyramidalis muscle was dissected free.  Entry into the peritoneal cavity was  accomplished sharply.  The Evangelina Ponto retractor was then placed into the abdominal cavity and the bowel was packed cephalad with laparotomy sponges.  Two Pean clamps were placed on the cornua and  used for retraction as well as a single-tooth  tenaculum and the fundus.  The patient had a 6-cm lower uterine segment fibroid that was noted at the beginning of the case.  The round ligaments on both sides were clamped, transected, and sutured ligated with 0 Vicryl suture.  The anterior leaf of the  broad ligament was incised.  There was some scar tissue there and that was taken down sharply and the bladder was reflected inferiorly with sharp and blunt dissection.  The uteroovarian ligaments were identified and a window was placed in the broad  ligament bilaterally and the ligament was doubly clamped, transected, and suture ligated with 0 Vicryl suture.  Each fallopian tube was then clamped, dissected, and suture ligated.  The uterine arteries were identified, skeletonized, and clamped with  Heaney clamps, transected, and suture ligated with 0 Vicryl suture.  Given the girth of the uterus and the distorting fibroid, the uterus was then amputated at the proximal portion of the cervix.  Straight Heaney clamps were then used paracervically  until the vagina was entered and the cervix was removed.  The vaginal cuff was then closed with interrupted 0 Vicryl suture.  Good hemostasis noted.  The abdomen was copiously irrigated.  Good hemostasis was noted throughout.  Ureters were then  visualized on the right with normal peristaltic activity.  The left ureter could not be identified.  Therefore, at the end of the procedure, cystoscopy was performed.  This part of the procedure is at the end of this dictation.  The fascia was then  closed with 0 Vicryl suture in a running nonlocking fashion.  Two  separate sutures were utilized.  The fascial edges were injected with 0.5% Marcaine .  Approximately 30 mL injected into the fascial edges.  Subcutaneous tissues were irrigated and Bovie  cautery used for hemostasis.  Given the depth of the subcutaneous tissues of 4 cm, the dead space was closed with a running 2-0 chromic  suture.  The skin was reapproximated with Insorb absorbable staples.  Cystoscopy was then performed with a 30-degree  cystoscope.  Normal efflux of urine from the left ureter was identified immediately.  The left ureter did not demonstrate efflux.  Intravenous fluorescein  followed by intravenous methylene blue  was instilled and after 40 minutes of visualizing the right  ureter with normal peristaltic activity, there was no efflux noted.  Cystoscope was removed.  Urology, Dr. Twylla was consulted, who came in approximately 30 minutes later after he finished his other surgery case, and he replaced the cystoscope, and at  this point, there was normal efflux of urine from each ostia bilaterally.  He did not think there was a need to do a retrograde filling of the right ureter given the efflux was normal.  The cystoscope was removed.  Foley catheter was replaced.  One extra gram of  intravenous Ancef  was administered given the length of the surgery, approximately 4 hours.  The incision was covered sterilely.  The patient did receive 30 mg Toradol  at the end of the procedure.  ESTIMATED BLOOD LOSS:  300 mL.  URINE OUTPUT:  300 mL.  INTRAOPERATIVE FLUIDS:  1000 mL  DISPOSITION:  The patient did tolerate the procedure well and was taken to the recovery room in good condition.   PUS D: 01/21/2024 12:31:40 pm T: 01/21/2024 4:47:00 pm  JOB: [id number]/ [id number]

## 2024-01-21 NOTE — Progress Notes (Signed)
 Ready for TAH , BS  for menorrhagia and fibroids. Labs reviewed . All questions answered. Proceed

## 2024-01-21 NOTE — Brief Op Note (Signed)
 01/21/2024  12:00 PM  PATIENT:  Delon LITTIE Glatter  48 y.o. female  PRE-OPERATIVE DIAGNOSIS:  menorrhagia fibriod uterus  POST-OPERATIVE DIAGNOSIS:  s/p menorrhagia fibriod uterus  PROCEDURE:  Procedure(s): HYSTERECTOMY, TOTAL, ABDOMINAL, WITH SALPINGECTOMY (Bilateral) CYSTOSCOPY (N/A)  SURGEON:  Surgeons and Role:    * Gavin Faivre, Debby PARAS, MD - Primary    * Verdon Keen, MD - Assisting    * Twylla, Glendia BROCKS, MD- urology consultant  PHYSICIAN ASSISTANT: PA Student Caryl  ASSISTANTS: none   ANESTHESIA:   general  EBL:  300 mL IOF 1000 cc  BLOOD ADMINISTERED:none  DRAINS: Urinary Catheter (Foley)   LOCAL MEDICATIONS USED:  MARCAINE      SPECIMEN:  Source of Specimen:  cervix , uterus  bilateral fallopian tubes   DISPOSITION OF SPECIMEN:  PATHOLOGY  COUNTS:  YES  TOURNIQUET:  * No tourniquets in log *  DICTATION: .Other Dictation: Dictation Number verbal   PLAN OF CARE: Admit to inpatient   PATIENT DISPOSITION:  PACU - hemodynamically stable.   Delay start of Pharmacological VTE agent (>24hrs) due to surgical blood loss or risk of bleeding: not applicable

## 2024-01-21 NOTE — Op Note (Signed)
   Preoperative diagnosis:  No efflux right ureteral orifice  Postoperative diagnosis:  Normal cystoscopy  Procedure: Cystoscopy  Surgeon: Glendia JAYSON Barba, MD  Anesthesia: General  Complications: None  EBL: None  Specimens: None  Indication: Linda Shaffer is a 48 y.o. female status post total abdominal hysterectomy and bilateral salpingectomy by Dr. Debby Dinsmore.    Description of procedure:  The patient was taken to the operating room and general anesthesia was induced.  The patient was placed in the dorsal lithotomy position, prepped and draped in the usual sterile fashion, and preoperative antibiotics were administered. A preoperative time-out was performed.   Post procedure cystoscopy was performed and no efflux was identified from the right ureter.  There was no suspicion of intraoperative ureteral injury.  I had just started a case when intraoperative consultation was requested.  My case was completed ~40 minutes later.  A 21 French cystoscope with 30 degree lens was lubricated and placed per urethra.  The bladder mucosa was normal in appearance without erythema, bleeding or any disruptions in mucosal integrity.  Trigone was normal in appearance.  UOs were orthotopic.  Brisk efflux of urine was identified bilaterally.  The urine was clear.  As cystoscopy demonstrated brisk efflux bilaterally a retrograde pyelogram was not recommended.   Glendia JAYSON Barba, M.D.

## 2024-01-21 NOTE — Progress Notes (Signed)
 Obstetric and Gynecology  Subjective  Linda Shaffer is a 48 y.o. female No obstetric history on file. who presented on 01/21/2024 for  .   Day of surgery . TAH, BS , cystoscopy  Adequate UO      Objective   Vitals:   01/21/24 1522 01/21/24 1539  BP:  136/75  Pulse:  73  Resp: 16 18  Temp:  98.3 F (36.8 C)  SpO2:  97%     Intake/Output Summary (Last 24 hours) at 01/21/2024 1750 Last data filed at 01/21/2024 1524 Gross per 24 hour  Intake 1502.16 ml  Output 1450 ml  Net 52.16 ml    General: NAD Cardiovascular: RRR, no murmurs Pulmonary: CTAB Abdomen: Benign. Non-tender, +BS, no guarding. Extremities: No erythema or cords, no calf tenderness, +warmth with normal peripheral pulses.  Labs: Results for orders placed or performed during the hospital encounter of 01/21/24 (from the past 24 hours)  ABO/Rh     Status: None   Collection Time: 01/21/24  6:28 AM  Result Value Ref Range   ABO/RH(D)      B POS Performed at [facility], [address] [place], [place] [address]   CBC     Status: Abnormal   Collection Time: 01/21/24  6:28 AM  Result Value Ref Range   WBC 6.9 4.0 - 10.5 K/uL   RBC 4.34 3.87 - 5.11 MIL/uL   Hemoglobin 10.1 (L) 12.0 - 15.0 g/dL   HCT 65.6 (L) 63.9 - 53.9 %   MCV 79.0 (L) 80.0 - 100.0 fL   MCH 23.3 (L) 26.0 - 34.0 pg   MCHC 29.4 (L) 30.0 - 36.0 g/dL   RDW 78.8 (H) 88.4 - 84.4 %   Platelets 391 150 - 400 K/uL   nRBC 0.0 0.0 - 0.2 %  Basic metabolic panel     Status: Abnormal   Collection Time: 01/21/24  6:28 AM  Result Value Ref Range   Sodium 141 135 - 145 mmol/L   Potassium 3.5 3.5 - 5.1 mmol/L   Chloride 106 98 - 111 mmol/L   CO2 22 22 - 32 mmol/L   Glucose, Bld 128 (H) 70 - 99 mg/dL   BUN 12 6 - 20 mg/dL   Creatinine, Ser 9.33 0.44 - 1.00 mg/dL   Calcium 9.2 8.9 - 89.6 mg/dL   GFR, Estimated >39 >39 mL/min   Anion gap 13 5 - 15  Glucose, capillary     Status: Abnormal   Collection Time: 01/21/24  6:28 AM  Result  Value Ref Range   Glucose-Capillary 128 (H) 70 - 99 mg/dL   Comment 1 Notify RN    Comment 2 Document in Chart   Pregnancy, urine POC     Status: None   Collection Time: 01/21/24  6:34 AM  Result Value Ref Range   Preg Test, Ur NEGATIVE NEGATIVE  Glucose, capillary     Status: Abnormal   Collection Time: 01/21/24 12:13 PM  Result Value Ref Range   Glucose-Capillary 130 (H) 70 - 99 mg/dL    Cultures: Results for orders placed or performed in visit on 04/16/19  Novel Coronavirus, NAA ([facility])     Status: None   Collection Time: 04/16/19 12:00 AM   Specimen: Oropharyngeal(OP) collection in vial transport medium   OROPHARYNGEA  TESTING  Result Value Ref Range Status   SARS-CoV-2, NAA Not Detected Not Detected Final    Comment: Testing was performed using the cobas(R) SARS-CoV-2 test. This nucleic acid amplification  test was developed and its performance characteristics determined by [employer]. Nucleic acid amplification tests include PCR and TMA. This test has not been FDA cleared or approved. This test has been authorized by FDA under an Emergency Use Authorization (EUA). This test is only authorized for the duration of time the declaration that circumstances exist justifying the authorization of the emergency use of in vitro diagnostic tests for detection of SARS-CoV-2 virus and/or diagnosis of COVID-19 infection under section 564(b)(1) of the Act, 21 U.S.C. 639aaa-6(a) (1), unless the authorization is terminated or revoked sooner. When diagnostic testing is negative, the possibility of a false negative result should be considered in the context of a patient's recent exposures and the presence of clinical signs and symptoms consistent with COVID-19. An individual without symptoms  of COVID-19 and who is not shedding SARS-CoV-2 virus would expect to have a negative (not detected) result in this assay.     Imaging: No results found.   Assessment   48 y.o. No  obstetric history on file. Hospital Day: 1   Plan   1. Stable   2. Pain in good control with PCA

## 2024-01-21 NOTE — Anesthesia Procedure Notes (Addendum)
 Procedure Name: Intubation Date/Time: 01/21/2024 7:49 AM  Performed by: Kearney Rosina SAILOR, RNPre-anesthesia Checklist: Patient identified, Emergency Drugs available, Suction available and Patient being monitored Patient Re-evaluated:Patient Re-evaluated prior to induction Oxygen Delivery Method: Circle system utilized Preoxygenation: Pre-oxygenation with 100% oxygen Induction Type: IV induction Ventilation: Mask ventilation without difficulty Laryngoscope Size: McGrath and 3 Grade View: Grade I Tube type: Oral Tube size: 6.5 mm Number of attempts: 1 Airway Equipment and Method: Stylet and Oral airway Placement Confirmation: ETT inserted through vocal cords under direct vision, positive ETCO2 and breath sounds checked- equal and bilateral Secured at: 21 cm Tube secured with: Tape Dental Injury: Teeth and Oropharynx as per pre-operative assessment  Comments: Atraumatic intubation

## 2024-01-22 ENCOUNTER — Encounter: Payer: Self-pay | Admitting: Obstetrics and Gynecology

## 2024-01-22 LAB — BASIC METABOLIC PANEL WITH GFR
Anion gap: 10 (ref 5–15)
BUN: 10 mg/dL (ref 6–20)
CO2: 25 mmol/L (ref 22–32)
Calcium: 8.9 mg/dL (ref 8.9–10.3)
Chloride: 103 mmol/L (ref 98–111)
Creatinine, Ser: 0.75 mg/dL (ref 0.44–1.00)
GFR, Estimated: >60 mL/min (ref >60)
Glucose, Bld: 110 mg/dL — ABNORMAL HIGH (ref 70–99)
Potassium: 3.9 mmol/L (ref 3.5–5.1)
Sodium: 138 mmol/L (ref 135–145)

## 2024-01-22 LAB — GLUCOSE, CAPILLARY
Glucose-Capillary: 116 mg/dL — ABNORMAL HIGH (ref 70–99)
Glucose-Capillary: 112 mg/dL — ABNORMAL HIGH (ref 70–99)
Glucose-Capillary: 96 mg/dL (ref 70–99)
Glucose-Capillary: 112 mg/dL — ABNORMAL HIGH (ref 70–99)

## 2024-01-22 LAB — CBC
HCT: 30.2 % — ABNORMAL LOW (ref 36.0–46.0)
Hemoglobin: 8.7 g/dL — ABNORMAL LOW (ref 12.0–15.0)
MCH: 22.8 pg — ABNORMAL LOW (ref 26.0–34.0)
MCHC: 28.8 g/dL — ABNORMAL LOW (ref 30.0–36.0)
MCV: 79.1 fL — ABNORMAL LOW (ref 80.0–100.0)
Platelets: 341 K/uL (ref 150–400)
RBC: 3.82 MIL/uL — ABNORMAL LOW (ref 3.87–5.11)
RDW: 21 % — ABNORMAL HIGH (ref 11.5–15.5)
WBC: 10.8 K/uL — ABNORMAL HIGH (ref 4.0–10.5)
nRBC: 0 % (ref 0.0–0.2)

## 2024-01-22 MED ORDER — OXYCODONE HCL 5 MG PO TABS: 5.0000 mg | ORAL_TABLET | ORAL | Status: DC | PRN

## 2024-01-22 MED ORDER — VALACYCLOVIR HCL 500 MG PO TABS
500.0000 mg | ORAL_TABLET | Freq: Every day | ORAL | Status: DC
  Administered 2024-01-22 – 2024-01-23 (×2): 500 mg via ORAL
  Filled 2024-01-22 (×2): qty 1

## 2024-01-22 MED ORDER — GABAPENTIN 300 MG PO CAPS: 300.0000 mg | ORAL_CAPSULE | Freq: Every day | ORAL | Status: DC

## 2024-01-22 MED ORDER — IBUPROFEN 600 MG PO TABS
600.0000 mg | ORAL_TABLET | Freq: Four times a day (QID) | ORAL | Status: DC
  Administered 2024-01-22 – 2024-01-23 (×2): 600 mg via ORAL
  Filled 2024-01-22 (×2): qty 1

## 2024-01-22 NOTE — Discharge Summary (Signed)
 Physician Discharge Summary  Patient ID: DEEANDRA JERRY MRN: [id number] DOB/AGE: 1976/01/19 47 y.o.  Admit date: 01/21/2024 Discharge date: 01/23/24  Admission Diagnoses: Menorrhagia fibroids  Discharge Diagnoses:  Principal Problem:   Menorrhagia Active Problems:   Post-operative state   Discharged Condition: good  Hospital Course: underwent an uncomplicated TAH , Bilat salpingectomy . PCA for first 16 hrs--> oral pain meds  POD#1 foley removed and tolerated regular food . Glucose value stable on her Metformin  . Ambulation without difficulty   Consults:   Significant Diagnostic Studies: {diagnostics:18242}  Treatments: surgery: as above   Discharge Exam: Blood pressure (!) 126/56, pulse 78, temperature 98.7 F (37.1 C), temperature source Oral, resp. rate 16, height 5' 5.5 (1.664 m), weight 108.9 kg, last menstrual period 12/25/2023, SpO2 99%. General appearance: alert and cooperative Resp: clear to auscultation bilaterally GI: soft, non-tender; bowel sounds normal; no masses,  no organomegaly Incision/Wound: C/D/I  Disposition:  D/c home  D/c meds Norco 5/325 mg 1po q 4 hr prn pain #20  Motrin  800 mg q 8 hrs prn pain  Gabapentin  300 mg q hs Zofran  8 mg q 8 hr prn n/v    Allergies as of 01/22/2024       Reactions   Peanut Oil Hives     Med Rec must be completed prior to using this SMARTLINK***        Signed: Debby PARAS Stanislav Gervase 01/22/2024, 5:39 PM

## 2024-01-22 NOTE — Progress Notes (Signed)
 1 Day Post-Op Procedure(s) (LRB): HYSTERECTOMY, TOTAL, ABDOMINAL, WITH SALPINGECTOMY (Bilateral) CYSTOSCOPY (N/A) CYSTOSCOPY  Subjective: Patient reports pain in good control.    Objective: I have reviewed patient's vital signs, intake and output, medications, and labs.  General: alert and cooperative Resp: clear to auscultation bilaterally Cardio: regular rate and rhythm, S1, S2 normal, no murmur, click, rub or gallop GI: soft, non-tender; bowel sounds normal; no masses,  no organomegaly Incision : covered C/D /I Assessment: s/p Procedure(s): HYSTERECTOMY, TOTAL, ABDOMINAL, WITH SALPINGECTOMY (Bilateral) CYSTOSCOPY (N/A) CYSTOSCOPY: stable  Plan: Advance diet Encourage ambulation Advance to PO medication Discontinue IV fluids  LOS: 1 day    Debby JINNY Dinsmore, MD 01/22/2024, 8:51 AM

## 2024-01-23 LAB — SURGICAL PATHOLOGY

## 2024-01-23 LAB — GLUCOSE, CAPILLARY: Glucose-Capillary: 85 mg/dL (ref 70–99)

## 2024-01-23 NOTE — Progress Notes (Signed)
 Patient discharged home with family. Discharge instructions, when to follow up, and prescriptions reviewed with patient. Patient verbalized understanding. Patient will be escorted out by auxiliary.

## 2024-01-28 ENCOUNTER — Inpatient Hospital Stay

## 2024-01-28 VITALS — BP 142/67 | HR 58 | Temp 98.5°F | Resp 18

## 2024-01-28 DIAGNOSIS — D649 Anemia, unspecified: Secondary | ICD-10-CM | POA: Diagnosis not present

## 2024-01-28 MED ORDER — SODIUM CHLORIDE 0.9% FLUSH
10.0000 mL | Freq: Once | INTRAVENOUS | Status: AC | PRN
  Administered 2024-01-28: 10 mL
  Filled 2024-01-28: qty 10

## 2024-01-28 MED ORDER — IRON SUCROSE 20 MG/ML IV SOLN
200.0000 mg | Freq: Once | INTRAVENOUS | Status: AC
  Administered 2024-01-28: 200 mg via INTRAVENOUS
  Filled 2024-01-28: qty 10

## 2024-02-04 ENCOUNTER — Encounter: Payer: Self-pay | Admitting: Internal Medicine

## 2024-02-04 ENCOUNTER — Inpatient Hospital Stay

## 2024-02-04 VITALS — BP 136/86 | HR 68 | Temp 97.2°F | Resp 18

## 2024-02-04 DIAGNOSIS — D649 Anemia, unspecified: Secondary | ICD-10-CM

## 2024-02-04 MED ORDER — IRON SUCROSE 20 MG/ML IV SOLN
200.0000 mg | Freq: Once | INTRAVENOUS | Status: AC
  Administered 2024-02-04: 200 mg via INTRAVENOUS
  Filled 2024-02-04: qty 10

## 2024-02-05 ENCOUNTER — Telehealth: Payer: Self-pay

## 2024-02-11 ENCOUNTER — Inpatient Hospital Stay

## 2024-02-11 VITALS — BP 146/81 | HR 58 | Temp 97.0°F | Resp 18

## 2024-02-11 DIAGNOSIS — D649 Anemia, unspecified: Secondary | ICD-10-CM

## 2024-02-11 MED ORDER — IRON SUCROSE 20 MG/ML IV SOLN
200.0000 mg | Freq: Once | INTRAVENOUS | Status: AC
  Administered 2024-02-11: 200 mg via INTRAVENOUS
  Filled 2024-02-11: qty 10

## 2024-02-11 MED ORDER — SODIUM CHLORIDE 0.9% FLUSH
10.0000 mL | Freq: Once | INTRAVENOUS | Status: AC | PRN
  Administered 2024-02-11: 10 mL
  Filled 2024-02-11: qty 10

## 2024-02-18 ENCOUNTER — Inpatient Hospital Stay

## 2024-03-03 ENCOUNTER — Inpatient Hospital Stay

## 2024-03-25 ENCOUNTER — Inpatient Hospital Stay: Attending: Internal Medicine

## 2024-03-25 ENCOUNTER — Inpatient Hospital Stay: Admitting: Internal Medicine

## 2024-03-25 ENCOUNTER — Encounter: Payer: Self-pay | Admitting: Internal Medicine

## 2024-03-25 ENCOUNTER — Inpatient Hospital Stay

## 2024-03-25 VITALS — BP 141/83 | HR 64 | Temp 98.0°F | Resp 16 | Ht 65.5 in | Wt 242.5 lb

## 2024-03-25 DIAGNOSIS — D500 Iron deficiency anemia secondary to blood loss (chronic): Secondary | ICD-10-CM | POA: Insufficient documentation

## 2024-03-25 DIAGNOSIS — Z3202 Encounter for pregnancy test, result negative: Secondary | ICD-10-CM | POA: Insufficient documentation

## 2024-03-25 DIAGNOSIS — Z803 Family history of malignant neoplasm of breast: Secondary | ICD-10-CM | POA: Insufficient documentation

## 2024-03-25 DIAGNOSIS — D649 Anemia, unspecified: Secondary | ICD-10-CM | POA: Diagnosis not present

## 2024-03-25 DIAGNOSIS — N920 Excessive and frequent menstruation with regular cycle: Secondary | ICD-10-CM | POA: Insufficient documentation

## 2024-03-25 LAB — PREGNANCY, URINE: Preg Test, Ur: NEGATIVE

## 2024-03-25 LAB — CBC WITH DIFFERENTIAL (CANCER CENTER ONLY)
Abs Immature Granulocytes: 0.01 K/uL (ref 0.00–0.07)
Basophils Absolute: 0 K/uL (ref 0.0–0.1)
Basophils Relative: 0 %
Eosinophils Absolute: 0.1 K/uL (ref 0.0–0.5)
Eosinophils Relative: 1 %
HCT: 39.9 % (ref 36.0–46.0)
Hemoglobin: 12.7 g/dL (ref 12.0–15.0)
Immature Granulocytes: 0 %
Lymphocytes Relative: 30 %
Lymphs Abs: 2 K/uL (ref 0.7–4.0)
MCH: 26.8 pg (ref 26.0–34.0)
MCHC: 31.8 g/dL (ref 30.0–36.0)
MCV: 84.4 fL (ref 80.0–100.0)
Monocytes Absolute: 0.4 K/uL (ref 0.1–1.0)
Monocytes Relative: 7 %
Neutro Abs: 4.1 K/uL (ref 1.7–7.7)
Neutrophils Relative %: 62 %
Platelet Count: 282 K/uL (ref 150–400)
RBC: 4.73 MIL/uL (ref 3.87–5.11)
RDW: 19.9 % — ABNORMAL HIGH (ref 11.5–15.5)
WBC Count: 6.6 K/uL (ref 4.0–10.5)
nRBC: 0 % (ref 0.0–0.2)

## 2024-03-25 LAB — BASIC METABOLIC PANEL - CANCER CENTER ONLY
Anion gap: 9 (ref 5–15)
BUN: 12 mg/dL (ref 6–20)
CO2: 23 mmol/L (ref 22–32)
Calcium: 9 mg/dL (ref 8.9–10.3)
Chloride: 106 mmol/L (ref 98–111)
Creatinine: 0.78 mg/dL (ref 0.44–1.00)
GFR, Estimated: >60 mL/min (ref >60)
Glucose, Bld: 98 mg/dL (ref 70–99)
Potassium: 3.5 mmol/L (ref 3.5–5.1)
Sodium: 138 mmol/L (ref 135–145)

## 2024-03-25 NOTE — Assessment & Plan Note (Addendum)
#   Anemia- Hb-8-9; ferritin- 6 [June 2025- PCP] symptomatic.  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 colonoscopy;- May 2025.   # Patient s/p Venofer -today hemoglobin is 12.5.  Would not recommend any further infusions.  Since no further blood loss noted-I think it is okay to come off the iron  pills.  #Since patient is clinically stable I think is reasonable for the patient to follow-up with PCP/can follow-up with us  as needed.  Patient comfortable with the plan; to call us  if any questions or concerns in the interim.  # DISPOSITION: # No venofer  # follow up  as needed- - Dr.B

## 2024-03-25 NOTE — Progress Notes (Signed)
 Fatigue/weakness: YES Dyspena: NO Light headedness: NO  Blood in stool: NO    01/21/24 hysterectomy,  Dr. Lovetta.

## 2024-03-25 NOTE — Progress Notes (Signed)
 [facility] CONSULT NOTE  Patient Care Team: Linda Idelia LABOR, MD as PCP - General (Family Medicine)  CHIEF COMPLAINTS/PURPOSE OF CONSULTATION: ANEMIA   HEMATOLOGY HISTORY  # ANEMIA[Hb; MCV-platelets- WBC; Iron  sat; ferritin;  GFR- CT/US - ;    6.2 5.2 6.3 5.8 Load older lab results    Hemoglobin 8.9 Low    9.5 Low    8.1 Low    8.3 Low    10.5 Low    Load older lab results  Hematocrit 31.7 Low    34.6 Low    29 Low    28.3 Low    34.6 Low    Load older lab results  Platelets 379 355 329 371 304 Load older lab results  MCV (Mean Corpuscular Volume) 79 Low    75 Low    73 Low    74 Low    82 Load older lab results  MCH (Mean Corpuscular Hemoglobin) 22.2 Low    20.5 Low    20.5 Low    21.7 Low    24.8 Low    Load older lab results  MCHC (Mean Corpuscular Hemoglobin Concentration) 28.1 Low    27.5 Low    27.9 Low    29.3 Low    30.3 Low    Load older lab results  RBC (Red Blood Cell Count) 4.01 4.63 3.96 3.82 4.24 Load older lab results  RDW-CV (Red Cell Distribution Width) 16.9 High    21.2 High    18.7 High    17.1 High    16.8 High    Load older lab results  MPV (Mean Platelet Volume) 10.7 10 10.1 9.7 10.4 Load older lab results  NRBC (Nucleated Red Blood Cell Count) 0.00 0 0 0.02 High    0 Load older lab results  NRBC % (Nucleated Red Blood Cell %) 0.0 0 0 0.3 0 Load older lab results   CHEM PROFILE ?12/25/2023 - CHEM PROFILE [facility])?  DIABETES ?12/25/2023 - DIABETES [facility])?  DIFFERENTIAL ?12/25/2023 - DIFFERENTIAL [facility])?  IRON  [name] ?12/25/2023 - IRON  [name] [facility])? Component 12/25/2023 04/23/2023 08/22/2022 02/28/2022        Ferritin 3 Low    4 Low    4 Low    4 Low      THYROID  HISTORY OF PRESENTING ILLNESS: Patient ambulating-independently.  Alone   Linda Shaffer 48 y.o.  female pleasant patient  history of menorrhagia with iron  deficiency is here for follow-up  In July patient had hysterectomy.  Uncomplicated.   Review of Systems  Constitutional:  Positive for malaise/fatigue. Negative for chills, diaphoresis, fever and weight loss.  HENT:  Negative for nosebleeds and sore throat.   Eyes:  Negative for double vision.  Respiratory:  Negative for cough, hemoptysis, sputum production, shortness of breath and wheezing.   Cardiovascular:  Negative for chest pain, palpitations, orthopnea and leg swelling.  Gastrointestinal:  Negative for abdominal pain, blood in stool, constipation, diarrhea, heartburn, melena, nausea and vomiting.  Genitourinary:  Negative for dysuria, frequency and urgency.  Musculoskeletal:  Negative for back pain and joint pain.  Skin: Negative.  Negative for itching and rash.  Neurological:  Negative for dizziness, tingling, focal weakness, weakness and headaches.  Endo/Heme/Allergies:  Does not bruise/bleed easily.  Psychiatric/Behavioral:  Negative for depression. The patient is not nervous/anxious and does not have insomnia.      MEDICAL HISTORY:  Past Medical History:  Diagnosis Date   Anemia    Diabetes mellitus without complication (HCC)    Hypercholesteremia    Hypertension     SURGICAL HISTORY: Past Surgical History:  Procedure Laterality Date   CESAREAN SECTION     COLONOSCOPY     CYSTOSCOPY N/A 01/21/2024   Procedure: CYSTOSCOPY;  Surgeon: Schermerhorn, Debby PARAS, MD;  Location: [facility] OR[facility];  Service: Gynecology;  Laterality: N/A;   CYSTOSCOPY  01/21/2024   Procedure: CYSTOSCOPY;  Surgeon: Twylla Glendia BROCKS, MD;  Location: [facility];  Service: Urology;;   DENTAL SURGERY     HYSTERECTOMY ABDOMINAL WITH SALPINGECTOMY Bilateral 01/21/2024   Procedure: HYSTERECTOMY, TOTAL, ABDOMINAL, WITH SALPINGECTOMY;  Surgeon: Schermerhorn, Debby PARAS, MD;  Location: [facility];  Service: Gynecology;  Laterality: Bilateral;    SOCIAL HISTORY: Social History    Socioeconomic History   Marital status: Single    Spouse name: Not on file   Number of children: Not on file   Years of education: Not on file   Highest education level: Not on file  Occupational History   Not on file  Tobacco Use   Smoking status: Never   Smokeless tobacco: Never  Vaping Use   Vaping status: Never Used  Substance and Sexual Activity   Alcohol use: Yes    Alcohol/week: 1.0 standard drink of alcohol    Types: 1 Standard drinks or equivalent per week   Drug use: Never   Sexual activity: Not on file  Other Topics Concern   Not on file  Social History Narrative   Not on file   Social Drivers of Health   Financial Resource Strain: Low Risk  (01/16/2024)   Received from [facility]   Overall Financial Resource Strain (CARDIA)    Difficulty of Paying Living Expenses: Not hard at all  Food Insecurity: No Food Insecurity (01/22/2024)   Hunger Vital Sign    Worried About Running Out of Food in the Last Year: Never true    Ran Out of Food in the Last Year: Never true  Transportation Needs: No Transportation Needs (01/22/2024)   PRAPARE - Administrator, Civil Service (Medical): No    Lack of Transportation (Non-Medical): No  Physical Activity: Not on file  Stress: Not on file  Social Connections: Not on file  Intimate Partner Violence: Not At Risk (01/22/2024)   Humiliation, Afraid, Rape, and Kick questionnaire    Fear of Current or Ex-Partner: No    Emotionally Abused: No    Physically Abused: No    Sexually Abused: No    FAMILY HISTORY: Family History  Problem Relation Age of Onset   Breast cancer Mother 1   Stroke Mother    Hypercholesterolemia Mother    Heart attack Mother    Hypertension Mother    Diabetes Mother    Hypertension Father    Heart attack Brother    Breast cancer Maternal Aunt 45   Breast cancer Maternal Grandmother 58   Dementia Paternal Grandmother    Cancer Paternal Grandfather    Stroke Paternal  Grandfather     ALLERGIES:  is allergic to peanut oil.  MEDICATIONS:  Current Outpatient Medications  Medication Sig Dispense Refill   ACCU-CHEK GUIDE test strip      Accu-Chek Softclix Lancets lancets SMARTSIG:Topical     amphetamine-dextroamphetamine (ADDERALL XR) 5 MG 24 hr capsule Take 5 mg by mouth daily as needed.     EPINEPHrine 0.3 mg/0.3 mL IJ SOAJ injection Inject 0.3 mg into  the muscle as needed.     famotidine (PEPCID) 10 MG tablet Take 10 mg by mouth as needed for heartburn or indigestion.     losartan  (COZAAR ) 25 MG tablet Take 25 mg by mouth 2 (two) times daily.     metFORMIN  (GLUCOPHAGE ) 500 MG tablet Take 500 mg by mouth daily with breakfast.     metoprolol  tartrate (LOPRESSOR ) 25 MG tablet Take 25 mg by mouth 2 (two) times daily.     pravastatin (PRAVACHOL) 40 MG tablet Take 40 mg by mouth at bedtime.     valACYclovir  (VALTREX ) 500 MG tablet Take 500 mg by mouth daily.     No current facility-administered medications for this visit.     PHYSICAL EXAMINATION:   Vitals:   03/25/24 1423  BP: (!) 141/83  Pulse: 64  Resp: 16  Temp: 98 F (36.7 C)  SpO2: 100%   Filed Weights   03/25/24 1423  Weight: 242 lb 8 oz (110 kg)    Physical Exam Vitals and nursing note reviewed.  HENT:     Head: Normocephalic and atraumatic.     Mouth/Throat:     Pharynx: Oropharynx is clear.  Eyes:     Extraocular Movements: Extraocular movements intact.     Pupils: Pupils are equal, round, and reactive to light.  Cardiovascular:     Rate and Rhythm: Normal rate and regular rhythm.  Pulmonary:     Comments: Decreased breath sounds bilaterally.  Abdominal:     Palpations: Abdomen is soft.  Musculoskeletal:        General: Normal range of motion.     Cervical back: Normal range of motion.  Skin:    General: Skin is warm.  Neurological:     General: No focal deficit present.     Mental Status: She is alert and oriented to person, place, and time.  Psychiatric:         Behavior: Behavior normal.        Judgment: Judgment normal.      LABORATORY DATA:  I have reviewed the data as listed Lab Results  Component Value Date   WBC 6.6 03/25/2024   HGB 12.7 03/25/2024   HCT 39.9 03/25/2024   MCV 84.4 03/25/2024   PLT 282 03/25/2024   Recent Labs    01/21/24 0628 01/22/24 0553 03/25/24 1421  NA 141 138 138  K 3.5 3.9 3.5  CL 106 103 106  CO2 22 25 23   GLUCOSE 128* 110* 98  BUN 12 10 12   CREATININE 0.66 0.75 0.78  CALCIUM 9.2 8.9 9.0  GFRNONAA >60 >60 >60     No results found.  ASSESSMENT & PLAN:   Symptomatic anemia # Anemia- Hb-8-9; ferritin- 6 [June 2025- PCP] symptomatic.  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 colonoscopy;- May 2025.   # Patient s/p Venofer -today hemoglobin is 12.5.  Would not recommend any further infusions.  Since no further blood loss noted-I think it is okay to come off the iron  pills.  #Since patient is clinically stable I think is reasonable for the patient to follow-up with PCP/can follow-up with us  as needed.  Patient comfortable with the plan; to call us  if any questions or concerns in the interim.  # DISPOSITION: # No venofer  # follow up  as needed- - Dr.B     All questions were answered. The patient knows to call the clinic with any problems, questions or concerns.  Cindy JONELLE Joe, MD 03/25/2024 3:52 PM
